# Patient Record
Sex: Female | Born: 1955 | Race: White | Hispanic: No | Marital: Single | Smoking: Never smoker
Health system: Southern US, Community
[De-identification: ages and names within clinical notes are randomized; demographics above are authoritative.]

## PROBLEM LIST (undated history)

## (undated) DIAGNOSIS — K219 Gastro-esophageal reflux disease without esophagitis: Secondary | ICD-10-CM

## (undated) DIAGNOSIS — E079 Disorder of thyroid, unspecified: Secondary | ICD-10-CM

## (undated) HISTORY — PX: OTHER SURGICAL HISTORY: SHX169

## (undated) HISTORY — DX: Gastro-esophageal reflux disease without esophagitis: K21.9

## (undated) HISTORY — DX: Disorder of thyroid, unspecified: E07.9

---

## 2004-05-22 ENCOUNTER — Ambulatory Visit: Payer: Self-pay | Admitting: Family Medicine

## 2004-09-22 ENCOUNTER — Ambulatory Visit: Payer: Self-pay | Admitting: Unknown Physician Specialty

## 2005-08-27 ENCOUNTER — Ambulatory Visit: Payer: Self-pay | Admitting: Family Medicine

## 2006-04-20 ENCOUNTER — Ambulatory Visit: Payer: Self-pay | Admitting: Family Medicine

## 2007-11-02 ENCOUNTER — Ambulatory Visit: Payer: Self-pay | Admitting: Family Medicine

## 2009-09-30 ENCOUNTER — Ambulatory Visit: Payer: Self-pay | Admitting: Internal Medicine

## 2010-03-03 ENCOUNTER — Ambulatory Visit: Payer: Self-pay | Admitting: Internal Medicine

## 2012-03-21 ENCOUNTER — Ambulatory Visit: Payer: Self-pay | Admitting: Internal Medicine

## 2012-04-14 ENCOUNTER — Ambulatory Visit: Payer: Self-pay | Admitting: Internal Medicine

## 2013-04-17 ENCOUNTER — Ambulatory Visit: Payer: Self-pay

## 2014-04-23 ENCOUNTER — Ambulatory Visit: Payer: Self-pay

## 2014-04-26 DIAGNOSIS — Z8639 Personal history of other endocrine, nutritional and metabolic disease: Secondary | ICD-10-CM | POA: Insufficient documentation

## 2014-11-05 ENCOUNTER — Other Ambulatory Visit: Payer: Self-pay | Admitting: Nurse Practitioner

## 2014-11-05 DIAGNOSIS — D376 Neoplasm of uncertain behavior of liver, gallbladder and bile ducts: Secondary | ICD-10-CM

## 2014-11-05 DIAGNOSIS — R10811 Right upper quadrant abdominal tenderness: Secondary | ICD-10-CM

## 2014-11-09 ENCOUNTER — Ambulatory Visit: Payer: Self-pay

## 2014-11-15 ENCOUNTER — Ambulatory Visit
Admission: RE | Admit: 2014-11-15 | Discharge: 2014-11-15 | Disposition: A | Discharge status: Home / Self Care | Payer: BLUE CROSS/BLUE SHIELD | Source: Ambulatory Visit | Attending: Nurse Practitioner | Admitting: Nurse Practitioner

## 2014-11-15 ENCOUNTER — Other Ambulatory Visit: Payer: Self-pay | Admitting: *Deleted

## 2014-11-15 DIAGNOSIS — R10811 Right upper quadrant abdominal tenderness: Secondary | ICD-10-CM | POA: Insufficient documentation

## 2014-11-15 DIAGNOSIS — D1803 Hemangioma of intra-abdominal structures: Secondary | ICD-10-CM | POA: Diagnosis not present

## 2014-11-15 DIAGNOSIS — R1011 Right upper quadrant pain: Secondary | ICD-10-CM

## 2014-11-15 DIAGNOSIS — D376 Neoplasm of uncertain behavior of liver, gallbladder and bile ducts: Secondary | ICD-10-CM

## 2014-11-15 MED ORDER — GADOBENATE DIMEGLUMINE 529 MG/ML IV SOLN
10.0000 mL | Freq: Once | INTRAVENOUS | Status: AC | PRN
  Administered 2014-11-15: 10 mL via INTRAVENOUS

## 2016-02-10 DIAGNOSIS — H04123 Dry eye syndrome of bilateral lacrimal glands: Secondary | ICD-10-CM | POA: Diagnosis not present

## 2016-02-12 DIAGNOSIS — M67442 Ganglion, left hand: Secondary | ICD-10-CM | POA: Diagnosis not present

## 2016-02-18 DIAGNOSIS — H04123 Dry eye syndrome of bilateral lacrimal glands: Secondary | ICD-10-CM | POA: Diagnosis not present

## 2016-03-12 DIAGNOSIS — K219 Gastro-esophageal reflux disease without esophagitis: Secondary | ICD-10-CM | POA: Diagnosis not present

## 2016-03-12 DIAGNOSIS — E039 Hypothyroidism, unspecified: Secondary | ICD-10-CM | POA: Diagnosis not present

## 2016-03-12 DIAGNOSIS — Z0001 Encounter for general adult medical examination with abnormal findings: Secondary | ICD-10-CM | POA: Diagnosis not present

## 2016-03-12 DIAGNOSIS — R7301 Impaired fasting glucose: Secondary | ICD-10-CM | POA: Diagnosis not present

## 2016-03-12 DIAGNOSIS — E559 Vitamin D deficiency, unspecified: Secondary | ICD-10-CM | POA: Diagnosis not present

## 2016-03-12 DIAGNOSIS — Z1159 Encounter for screening for other viral diseases: Secondary | ICD-10-CM | POA: Diagnosis not present

## 2016-03-13 DIAGNOSIS — E559 Vitamin D deficiency, unspecified: Secondary | ICD-10-CM | POA: Diagnosis not present

## 2016-03-13 DIAGNOSIS — Z0001 Encounter for general adult medical examination with abnormal findings: Secondary | ICD-10-CM | POA: Diagnosis not present

## 2016-03-13 DIAGNOSIS — E039 Hypothyroidism, unspecified: Secondary | ICD-10-CM | POA: Diagnosis not present

## 2016-03-13 DIAGNOSIS — R7301 Impaired fasting glucose: Secondary | ICD-10-CM | POA: Diagnosis not present

## 2016-03-25 DIAGNOSIS — M67442 Ganglion, left hand: Secondary | ICD-10-CM | POA: Diagnosis not present

## 2016-04-15 DIAGNOSIS — H401131 Primary open-angle glaucoma, bilateral, mild stage: Secondary | ICD-10-CM | POA: Diagnosis not present

## 2016-09-08 DIAGNOSIS — E039 Hypothyroidism, unspecified: Secondary | ICD-10-CM | POA: Diagnosis not present

## 2016-09-08 DIAGNOSIS — K219 Gastro-esophageal reflux disease without esophagitis: Secondary | ICD-10-CM | POA: Diagnosis not present

## 2016-09-08 DIAGNOSIS — R002 Palpitations: Secondary | ICD-10-CM | POA: Diagnosis not present

## 2016-09-09 DIAGNOSIS — R079 Chest pain, unspecified: Secondary | ICD-10-CM | POA: Diagnosis not present

## 2016-09-09 DIAGNOSIS — R001 Bradycardia, unspecified: Secondary | ICD-10-CM | POA: Diagnosis not present

## 2016-10-12 DIAGNOSIS — R7301 Impaired fasting glucose: Secondary | ICD-10-CM | POA: Diagnosis not present

## 2016-10-12 DIAGNOSIS — R001 Bradycardia, unspecified: Secondary | ICD-10-CM | POA: Diagnosis not present

## 2016-10-12 DIAGNOSIS — K219 Gastro-esophageal reflux disease without esophagitis: Secondary | ICD-10-CM | POA: Diagnosis not present

## 2016-10-12 DIAGNOSIS — R002 Palpitations: Secondary | ICD-10-CM | POA: Diagnosis not present

## 2016-10-12 DIAGNOSIS — E039 Hypothyroidism, unspecified: Secondary | ICD-10-CM | POA: Diagnosis not present

## 2016-11-12 DIAGNOSIS — R002 Palpitations: Secondary | ICD-10-CM | POA: Diagnosis not present

## 2017-03-17 DIAGNOSIS — E039 Hypothyroidism, unspecified: Secondary | ICD-10-CM | POA: Diagnosis not present

## 2017-03-17 DIAGNOSIS — R002 Palpitations: Secondary | ICD-10-CM | POA: Diagnosis not present

## 2017-03-17 DIAGNOSIS — Z0000 Encounter for general adult medical examination without abnormal findings: Secondary | ICD-10-CM | POA: Diagnosis not present

## 2017-04-14 DIAGNOSIS — H401131 Primary open-angle glaucoma, bilateral, mild stage: Secondary | ICD-10-CM | POA: Diagnosis not present

## 2017-04-14 DIAGNOSIS — H04123 Dry eye syndrome of bilateral lacrimal glands: Secondary | ICD-10-CM | POA: Diagnosis not present

## 2017-06-01 DIAGNOSIS — H401131 Primary open-angle glaucoma, bilateral, mild stage: Secondary | ICD-10-CM | POA: Diagnosis not present

## 2017-07-29 DIAGNOSIS — H5210 Myopia, unspecified eye: Secondary | ICD-10-CM | POA: Diagnosis not present

## 2017-07-29 DIAGNOSIS — H2513 Age-related nuclear cataract, bilateral: Secondary | ICD-10-CM | POA: Diagnosis not present

## 2017-07-29 DIAGNOSIS — H04123 Dry eye syndrome of bilateral lacrimal glands: Secondary | ICD-10-CM | POA: Diagnosis not present

## 2017-08-17 DIAGNOSIS — H2513 Age-related nuclear cataract, bilateral: Secondary | ICD-10-CM | POA: Diagnosis not present

## 2017-08-17 DIAGNOSIS — H04123 Dry eye syndrome of bilateral lacrimal glands: Secondary | ICD-10-CM | POA: Diagnosis not present

## 2017-08-17 DIAGNOSIS — H04563 Stenosis of bilateral lacrimal punctum: Secondary | ICD-10-CM | POA: Diagnosis not present

## 2017-08-24 ENCOUNTER — Other Ambulatory Visit: Payer: Self-pay

## 2017-08-24 MED ORDER — LEVOTHYROXINE SODIUM 75 MCG PO TABS: 75.0000 ug | ORAL_TABLET | Freq: Every day | ORAL | 5 refills | Status: DC

## 2017-09-13 ENCOUNTER — Encounter: Payer: Self-pay | Admitting: Nurse Practitioner

## 2017-09-13 ENCOUNTER — Ambulatory Visit: Payer: BLUE CROSS/BLUE SHIELD | Admitting: Nurse Practitioner

## 2017-09-13 VITALS — BP 113/68 | HR 55 | Resp 16 | Ht 64.0 in | Wt 99.0 lb

## 2017-09-13 DIAGNOSIS — I7300 Raynaud's syndrome without gangrene: Secondary | ICD-10-CM | POA: Insufficient documentation

## 2017-09-13 DIAGNOSIS — R002 Palpitations: Secondary | ICD-10-CM

## 2017-09-13 DIAGNOSIS — K439 Ventral hernia without obstruction or gangrene: Secondary | ICD-10-CM

## 2017-09-13 DIAGNOSIS — R10827 Generalized rebound abdominal tenderness: Secondary | ICD-10-CM | POA: Diagnosis not present

## 2017-09-13 DIAGNOSIS — E039 Hypothyroidism, unspecified: Secondary | ICD-10-CM | POA: Diagnosis not present

## 2017-09-13 MED ORDER — LEVOTHYROXINE SODIUM 75 MCG PO TABS
75.0000 ug | ORAL_TABLET | Freq: Every day | ORAL | 5 refills | Status: DC
Start: 2017-09-13 — End: 2018-07-29

## 2017-09-13 NOTE — Progress Notes (Signed)
[facility] [place], [place] [address]  Internal MEDICINE  Office Visit Note  Patient Name: Regina Irwin  [id number]  [id number]  Date of Service: 09/13/2017  Chief Complaint  Patient presents with  . Hypothyroidism    The patient is here for routine follow up visit. She states that she continues to have issues with her heart. States that it does "funny things."  Still has palpitations. Prior studies have been essentially normal. She also suffers from very cold hands and feet. Worse when it's cold outside. Her fingers are especially cold. actally affecting her work this year. Right index finger will get so cold it gets numb. Difficult to use the mouse.  Also having trouble with her stomach. Everyday, a few hours after she eats lunch, she gets "tearing" type pain in the area around her abdomen. Lasts for an hour or so then resolves. She does feel a palpable mass in the area of her umbilicus. Prior studies of the abdomen have been unremarkable.    Pt is here for routine follow up.    Current Medication: Outpatient Encounter Medications as of 09/13/2017  Medication Sig  . Cholecalciferol (VITAMIN D3) 1000 units CAPS Take by mouth.  . levothyroxine (SYNTHROID, LEVOTHROID) 75 MCG tablet Take 1 tablet (75 mcg total) by mouth daily before breakfast.  . Multiple Vitamin (MULTI-VITAMINS) TABS Take by mouth.  . [DISCONTINUED] levothyroxine (SYNTHROID, LEVOTHROID) 75 MCG tablet Take 1 tablet (75 mcg total) by mouth daily before breakfast.   No facility-administered encounter medications on file as of 09/13/2017.     Surgical History: Past Surgical History:  Procedure Laterality Date  . tumor removed  right foot      Medical History: Past Medical History:  Diagnosis Date  . Thyroid disease     Family History: Family History  Problem Relation Age of Onset  . Diabetes Father   . Drug abuse Paternal Grandmother   . Drug abuse Paternal Grandfather      Social History   Socioeconomic History  . Marital status: Single    Spouse name: Not on file  . Number of children: Not on file  . Years of education: Not on file  . Highest education level: Not on file  Occupational History  . Not on file  Social Needs  . Financial resource strain: Not on file  . Food insecurity:    Worry: Not on file    Inability: Not on file  . Transportation needs:    Medical: Not on file    Non-medical: Not on file  Tobacco Use  . Smoking status: Never Smoker  . Smokeless tobacco: Never Used  Substance and Sexual Activity  . Alcohol use: Never    Frequency: Never  . Drug use: Never  . Sexual activity: Not on file  Lifestyle  . Physical activity:    Days per week: Not on file    Minutes per session: Not on file  . Stress: Not on file  Relationships  . Social connections:    Talks on phone: Not on file    Gets together: Not on file    Attends religious service: Not on file    Active member of club or organization: Not on file    Attends meetings of clubs or organizations: Not on file    Relationship status: Not on file  . Intimate partner violence:    Fear of current or ex partner: Not on file    Emotionally abused: Not on  file    Physically abused: Not on file    Forced sexual activity: Not on file  Other Topics Concern  . Not on file  Social History Narrative  . Not on file      Review of Systems  Constitutional: Negative for activity change, chills, fatigue and unexpected weight change.  HENT: Positive for postnasal drip. Negative for congestion, rhinorrhea, sneezing and sore throat.   Eyes: Negative.  Negative for redness.  Respiratory: Negative for cough, chest tightness and shortness of breath.   Cardiovascular: Negative for chest pain and palpitations.       Palpitations.  Gastrointestinal: Positive for abdominal pain, constipation and diarrhea. Negative for nausea and vomiting.  Endocrine:       Hypothyroid.    Genitourinary: Negative for dysuria and frequency.  Musculoskeletal: Negative for arthralgias, back pain, joint swelling and neck pain.  Skin: Negative for rash.  Neurological: Negative for dizziness, tremors, numbness and headaches.  Hematological: Negative for adenopathy. Does not bruise/bleed easily.  Psychiatric/Behavioral: Negative for behavioral problems (Depression), sleep disturbance and suicidal ideas. The patient is nervous/anxious.     Today's Vitals   09/13/17 0931  BP: 113/68  Pulse: (!) 55  Resp: 16  SpO2: 98%  Weight: 99 lb (44.9 kg)  Height: 5\' 4"  (2.993 m)    Physical Exam  Constitutional: She is oriented to person, place, and time. She appears well-developed and well-nourished. No distress.  HENT:  Head: Normocephalic and atraumatic.  Mouth/Throat: Oropharynx is clear and moist. No oropharyngeal exudate.  Eyes: Pupils are equal, round, and reactive to light. EOM are normal.  Neck: Normal range of motion. Neck supple. No JVD present. Carotid bruit is not present. No tracheal deviation present. No thyromegaly present.  Cardiovascular: Normal rate, regular rhythm and normal heart sounds. Exam reveals no gallop and no friction rub.  No murmur heard. Pulmonary/Chest: Effort normal. No respiratory distress. She has no wheezes. She has no rales. She exhibits no tenderness.  Abdominal: Soft. Bowel sounds are increased. There is no hepatosplenomegaly. There is tenderness in the periumbilical area. There is no CVA tenderness. A hernia is present. Hernia confirmed positive in the ventral area.    Musculoskeletal: Normal range of motion.  Lymphadenopathy:    She has no cervical adenopathy.  Neurological: She is alert and oriented to person, place, and time. No cranial nerve deficit.  Skin: Skin is warm and dry. She is not diaphoretic.  Psychiatric: She has a normal mood and affect. Her behavior is normal. Judgment and thought content normal.  Nursing note and vitals  reviewed.   Assessment/Plan: 1. Acquired hypothyroidism Lab slip givent to check thyroid panel. Will adjust levothyroxine as indicated.  - levothyroxine (SYNTHROID, LEVOTHROID) 75 MCG tablet; Take 1 tablet (75 mcg total) by mouth daily before breakfast.  Dispense: 30 tablet; Refill: 5  2. Generalized abdominal tenderness with rebound tenderness. Very hyperactive bowel sounds present.  - US Abdomen Complete; Future  3. Ventral hernia without obstruction or gangrene Will get abdominal ultrasound for further evaluation. Refer to surgery/GI as idicated   4. Palpitations Cardiac testing has been unremarkable. Will monitor.   5. Raynaud's phenomenon. - discussed addition of calcium channel blocker to reduce vascular spasms and regulate heart rhythm. Patient wishes to hold off on this treatment for now.   General Counseling: ann-marie kluge understanding of the findings of todays visit and agrees with plan of treatment. I have discussed any further diagnostic evaluation that may be needed or ordered today. We also  reviewed her medications today. she has been encouraged to call the office with any questions or concerns that should arise related to todays visit.   This patient was seen by Leretha Pol, FNP- C in Collaboration with Dr Lavera Guise as a part of collaborative care agreement    Orders Placed This Encounter  Procedures  . US Abdomen Complete    Meds ordered this encounter  Medications  . levothyroxine (SYNTHROID, LEVOTHROID) 75 MCG tablet    Sig: Take 1 tablet (75 mcg total) by mouth daily before breakfast.    Dispense:  30 tablet    Refill:  5    Order Specific Question:   Supervising Provider    Answer:   Lavera Guise [[id number]]    Time spent: 37 Minutes    Dr Lavera Guise Internal medicine

## 2017-10-22 ENCOUNTER — Ambulatory Visit (INDEPENDENT_AMBULATORY_CARE_PROVIDER_SITE_OTHER): Payer: BLUE CROSS/BLUE SHIELD

## 2017-10-22 DIAGNOSIS — R10827 Generalized rebound abdominal tenderness: Secondary | ICD-10-CM

## 2017-10-29 ENCOUNTER — Ambulatory Visit: Payer: Self-pay | Admitting: Nurse Practitioner

## 2017-11-01 ENCOUNTER — Encounter: Payer: Self-pay | Admitting: Nurse Practitioner

## 2017-11-01 ENCOUNTER — Ambulatory Visit: Payer: BLUE CROSS/BLUE SHIELD | Admitting: Nurse Practitioner

## 2017-11-01 VITALS — BP 102/49 | HR 55 | Resp 16 | Ht 64.0 in | Wt 98.2 lb

## 2017-11-01 DIAGNOSIS — K828 Other specified diseases of gallbladder: Secondary | ICD-10-CM

## 2017-11-01 DIAGNOSIS — R10827 Generalized rebound abdominal tenderness: Secondary | ICD-10-CM | POA: Diagnosis not present

## 2017-11-01 DIAGNOSIS — E039 Hypothyroidism, unspecified: Secondary | ICD-10-CM | POA: Diagnosis not present

## 2017-11-01 NOTE — Patient Instructions (Signed)
Cholecystitis °Cholecystitis is swelling and irritation (inflammation) of the gallbladder. The gallbladder is an organ that is shaped like a pear. It is under the liver on the right side of the body. This condition is often caused by gallstones. You doctor may do tests to see how your gallbladder works. These tests may include: °· Imaging tests, such as: °? An ultrasound. °? MRI. °· Tests that check how your liver works. ° °This condition needs treatment. °Follow these instructions at home: °Home care will depend on your treatment. In general: °· Take over-the-counter and prescription medicines only as told by your doctor. °· If you were prescribed an antibiotic medicine, take it as told by your doctor. Do not stop taking the antibiotic even if you start to feel better. °· Follow instructions from your doctor about what to eat or drink. When you are allowed to eat, avoid eating or drinking anything that causes your symptoms to start. °· Keep all follow-up visits as told by your doctor. This is important. ° °Contact a doctor if: °· You have pain and your medicine does not help. °· You have a fever. °Get help right away if: °· Your pain moves to: °? Another part of your belly (abdomen). °? Your back. °· Your symptoms do not go away. °· You have new symptoms. °This information is not intended to replace advice given to you by your health care provider. Make sure you discuss any questions you have with your health care provider. °Document Released: 05/28/2011 Document Revised: 11/14/2015 Document Reviewed: 09/19/2014 °Elsevier Interactive Patient Education © 2018 [employer] Inc[employer] ° °

## 2017-11-01 NOTE — Progress Notes (Signed)
[facility] [place], [place] [address]  Internal MEDICINE  Office Visit Note  Patient Name: Regina Irwin  [id number]  [id number]  Date of Service: 11/01/2017   Pt is here for routine follow up.   Chief Complaint  Patient presents with  . Hypothyroidism    follow up US    Also having trouble with her stomach. Everyday, a few hours after she eats lunch, she gets "tearing" type pain in the area around her abdomen. Lasts for an hour or so then resolves. She does feel a palpable mass in the area of her umbilicus. Prior studies of the abdomen have been unremarkable. She has had abdominal ultrasound since her last visit. There is minimal sludge present in the gallbladder. The study was otherwise normal .       Current Medication: Outpatient Encounter Medications as of 11/01/2017  Medication Sig  . Cholecalciferol (VITAMIN D3) 1000 units CAPS Take by mouth.  . levothyroxine (SYNTHROID, LEVOTHROID) 75 MCG tablet Take 1 tablet (75 mcg total) by mouth daily before breakfast.  . Multiple Vitamin (MULTI-VITAMINS) TABS Take by mouth.   No facility-administered encounter medications on file as of 11/01/2017.     Surgical History: Past Surgical History:  Procedure Laterality Date  . tumor removed  right foot      Medical History: Past Medical History:  Diagnosis Date  . Thyroid disease     Family History: Family History  Problem Relation Age of Onset  . Diabetes Father   . Drug abuse Paternal Grandmother   . Drug abuse Paternal Grandfather     Social History   Socioeconomic History  . Marital status: Single    Spouse name: Not on file  . Number of children: Not on file  . Years of education: Not on file  . Highest education level: Not on file  Occupational History  . Not on file  Social Needs  . Financial resource strain: Not on file  . Food insecurity:    Worry: Not on file    Inability: Not on file  . Transportation needs:    Medical:  Not on file    Non-medical: Not on file  Tobacco Use  . Smoking status: Never Smoker  . Smokeless tobacco: Never Used  Substance and Sexual Activity  . Alcohol use: Never    Frequency: Never  . Drug use: Never  . Sexual activity: Not on file  Lifestyle  . Physical activity:    Days per week: Not on file    Minutes per session: Not on file  . Stress: Not on file  Relationships  . Social connections:    Talks on phone: Not on file    Gets together: Not on file    Attends religious service: Not on file    Active member of club or organization: Not on file    Attends meetings of clubs or organizations: Not on file    Relationship status: Not on file  . Intimate partner violence:    Fear of current or ex partner: Not on file    Emotionally abused: Not on file    Physically abused: Not on file    Forced sexual activity: Not on file  Other Topics Concern  . Not on file  Social History Narrative  . Not on file      Review of Systems  Constitutional: Positive for appetite change. Negative for activity change, chills, fatigue and unexpected weight change.  HENT: Positive for postnasal  drip. Negative for congestion, rhinorrhea, sneezing and sore throat.   Eyes: Negative.  Negative for redness.  Respiratory: Negative for cough, chest tightness and shortness of breath.   Cardiovascular: Negative for chest pain and palpitations.       Palpitations.  Gastrointestinal: Positive for abdominal pain, constipation, diarrhea and nausea. Negative for vomiting.  Endocrine: Negative for cold intolerance, heat intolerance, polydipsia, polyphagia and polyuria.       Hypothyroid.   Genitourinary: Negative for dysuria and frequency.  Musculoskeletal: Negative for arthralgias, back pain, joint swelling and neck pain.  Skin: Negative for rash.  Allergic/Immunologic: Negative for environmental allergies.  Neurological: Negative for dizziness, tremors, numbness and headaches.  Hematological:  Negative for adenopathy. Does not bruise/bleed easily.  Psychiatric/Behavioral: Negative for behavioral problems (Depression), sleep disturbance and suicidal ideas. The patient is nervous/anxious.     Vital Signs: BP (!) 102/49   Pulse (!) 55   Resp 16   Ht 5\' 4"  (1.626 m)   Wt 98 lb 3.2 oz (44.5 kg)   SpO2 100%   BMI 16.86 kg/m    Physical Exam  Constitutional: She is oriented to person, place, and time. She appears well-developed and well-nourished. No distress.  HENT:  Head: Normocephalic and atraumatic.  Mouth/Throat: Oropharynx is clear and moist. No oropharyngeal exudate.  Eyes: Pupils are equal, round, and reactive to light. EOM are normal.  Neck: Normal range of motion. Neck supple. No JVD present. Carotid bruit is not present. No tracheal deviation present. No thyromegaly present.  Cardiovascular: Normal rate, regular rhythm and normal heart sounds. Exam reveals no gallop and no friction rub.  No murmur heard. Pulmonary/Chest: Effort normal. No respiratory distress. She has no wheezes. She has no rales. She exhibits no tenderness.  Abdominal: Soft. Bowel sounds are increased. There is no hepatosplenomegaly. There is tenderness in the periumbilical area. There is no CVA tenderness. A hernia is present. Hernia confirmed positive in the ventral area.    Musculoskeletal: Normal range of motion.  Lymphadenopathy:    She has no cervical adenopathy.  Neurological: She is alert and oriented to person, place, and time. No cranial nerve deficit.  Skin: Skin is warm and dry. She is not diaphoretic.  Psychiatric: She has a normal mood and affect. Her behavior is normal. Judgment and thought content normal.  Nursing note and vitals reviewed.   Assessment/Plan: 1. Generalized abdominal tenderness with rebound tenderness Abdominal ultrasound did show minimal sludge in the gallbladder, but was otherwise normal. Due to positive symptoms and u/s results, refer to surgery for further  evaluation and treatment.  - Ambulatory referral to General Surgery  2. Gallbladder sludge - Ambulatory referral to General Surgery  3. Acquired hypothyroidism Stable. Continue levothyroxine as prescribed.   General Counseling: sallee hogrefe understanding of the findings of todays visit and agrees with plan of treatment. I have discussed any further diagnostic evaluation that may be needed or ordered today. We also reviewed her medications today. she has been encouraged to call the office with any questions or concerns that should arise related to todays visit.  This patient was seen by Leretha Pol, FNP- C in Collaboration with Dr Lavera Guise as a part of collaborative care agreement   Orders Placed This Encounter  Procedures  . Ambulatory referral to General Surgery      Time spent: [place] [facility]

## 2017-11-02 ENCOUNTER — Encounter: Payer: Self-pay | Admitting: *Deleted

## 2017-11-11 ENCOUNTER — Ambulatory Visit: Payer: BLUE CROSS/BLUE SHIELD | Admitting: Nurse Practitioner

## 2017-11-11 ENCOUNTER — Encounter: Payer: Self-pay | Admitting: Nurse Practitioner

## 2017-11-11 VITALS — BP 108/60 | HR 87 | Resp 16 | Ht 64.0 in | Wt 98.6 lb

## 2017-11-11 DIAGNOSIS — R5383 Other fatigue: Secondary | ICD-10-CM

## 2017-11-11 DIAGNOSIS — E039 Hypothyroidism, unspecified: Secondary | ICD-10-CM | POA: Diagnosis not present

## 2017-11-11 DIAGNOSIS — R413 Other amnesia: Secondary | ICD-10-CM

## 2017-11-11 DIAGNOSIS — E162 Hypoglycemia, unspecified: Secondary | ICD-10-CM

## 2017-11-11 NOTE — Progress Notes (Signed)
[facility] [place], [place] [address]  Internal MEDICINE  Office Visit Note  Patient Name: Regina Irwin  [id number]  [id number]  Date of Service: 12/04/2017    Pt is here for a sick visit.  Chief Complaint  Patient presents with  . Memory Loss    process of being terminated      The patient has been having trouble with her memory and with concentration. She is having trouble leaving this out at work. Getting "dinged" for leaving information out when she is entering information at work. Will have correct results, however, she will have no memory of leaving out some of the information. She has had episodes when she has had to move and count things, and then when completed, she finds that she missed an entire row of something without having any memory of ever seeing the products she was to include in her count. Has been an issue several years ago, when first diagnosed with thyroid issues. She is also concerned that low sugars could be causing this to happen.       Current Medication:  Outpatient Encounter Medications as of 11/11/2017  Medication Sig  . Cholecalciferol (VITAMIN D3) 1000 units CAPS Take by mouth.  . levothyroxine (SYNTHROID, LEVOTHROID) 75 MCG tablet Take 1 tablet (75 mcg total) by mouth daily before breakfast.  . Multiple Vitamin (MULTI-VITAMINS) TABS Take by mouth.   No facility-administered encounter medications on file as of 11/11/2017.       Medical History: Past Medical History:  Diagnosis Date  . Thyroid disease      Today's Vitals   11/11/17 1016  BP: 108/60  Pulse: 87  Resp: 16  SpO2: 90%  Weight: 98 lb 9.6 oz (44.7 kg)  Height: 5\' 4"  (1.626 m)    Review of Systems  Constitutional: Positive for activity change. Negative for chills, fatigue and unexpected weight change.  HENT: Negative for congestion, postnasal drip, rhinorrhea, sneezing and sore throat.   Eyes: Negative.  Negative for redness.  Respiratory:  Negative for cough, chest tightness and shortness of breath.   Cardiovascular: Negative for chest pain and palpitations.  Gastrointestinal: Negative for abdominal pain, constipation, diarrhea, nausea and vomiting.  Endocrine:       Has had stable thyroid panel over past several years. Has had a few episodes of low blood sugars.   Genitourinary: Negative for dysuria and frequency.  Musculoskeletal: Negative for arthralgias, back pain, joint swelling and neck pain.  Skin: Negative for rash.  Allergic/Immunologic: Negative for environmental allergies.  Neurological: Negative for tremors and numbness.       Memory issues. Has episodes of time she has no memory or recollection of.   Hematological: Negative for adenopathy. Does not bruise/bleed easily.  Psychiatric/Behavioral: Positive for decreased concentration. Negative for behavioral problems (Depression), sleep disturbance and suicidal ideas. The patient is nervous/anxious.     Physical Exam  Constitutional: She is oriented to person, place, and time. She appears well-developed and well-nourished. No distress.  HENT:  Head: Normocephalic and atraumatic.  Mouth/Throat: Oropharynx is clear and moist. No oropharyngeal exudate.  Eyes: Pupils are equal, round, and reactive to light. EOM are normal.  Neck: Normal range of motion. Neck supple. No JVD present. No tracheal deviation present. No thyromegaly present.  Cardiovascular: Normal rate, regular rhythm and normal heart sounds. Exam reveals no gallop and no friction rub.  No murmur heard. Pulmonary/Chest: Effort normal. No respiratory distress. She has no wheezes. She has no rales. She  exhibits no tenderness.  Abdominal: Soft. Bowel sounds are normal.  Musculoskeletal: Normal range of motion.  Lymphadenopathy:    She has no cervical adenopathy.  Neurological: She is alert and oriented to person, place, and time. No cranial nerve deficit.  A mini mental status exam was administered today and  was normal   Skin: Skin is warm and dry. She is not diaphoretic.  Psychiatric: Her speech is normal and behavior is normal. Judgment and thought content normal. Her mood appears anxious. She exhibits abnormal recent memory.  Nursing note and vitals reviewed.  Assessment/Plan: 1. Memory loss Having issues with intermittent memory loss. Refer to neurology for further evaluation and treatment.  - Ambulatory referral to Neurology  2. Acquired hypothyroidism - TSH + free T4 - Thyroid peroxidase antibody - Thyroglobulin Level  3. Fatigue, unspecified type - CBC w/Diff/Platelet  4. Hypoglycemia Check labs  - HgB A1c   General Counseling: Myrah verbalizes understanding of the findings of todays visit and agrees with plan of treatment. I have discussed any further diagnostic evaluation that may be needed or ordered today. We also reviewed her medications today. she has been encouraged to call the office with any questions or concerns that should arise related to todays visit.    Counseling:  This patient was seen by Leretha Pol, FNP- C in Collaboration with Dr Lavera Guise as a part of collaborative care agreement  Orders Placed This Encounter  Procedures  . TSH + free T4  . Thyroid peroxidase antibody  . CBC w/Diff/Platelet  . HgB A1c  . Thyroglobulin Level  . Ambulatory referral to Neurology      Time spent: 20 Minutes

## 2017-11-16 ENCOUNTER — Encounter: Payer: Self-pay | Admitting: Neurology

## 2017-11-18 LAB — CBC WITH DIFFERENTIAL/PLATELET
Basophils Absolute: 0 10*3/uL (ref 0.0–0.2)
Basos: 1 %
EOS (ABSOLUTE): 0 10*3/uL (ref 0.0–0.4)
EOS: 1 %
HEMATOCRIT: 39.5 % (ref 34.0–46.6)
HEMOGLOBIN: 13.5 g/dL (ref 11.1–15.9)
Immature Grans (Abs): 0 10*3/uL (ref 0.0–0.1)
Immature Granulocytes: 1 %
LYMPHS ABS: 1.1 10*3/uL (ref 0.7–3.1)
LYMPHS: 30 %
MCH: 31.3 pg (ref 26.6–33.0)
MCHC: 34.2 g/dL (ref 31.5–35.7)
MCV: 92 fL (ref 79–97)
MONOCYTES: 12 %
Monocytes Absolute: 0.5 10*3/uL (ref 0.1–0.9)
NEUTROS PCT: 55 %
Neutrophils Absolute: 2.1 10*3/uL (ref 1.4–7.0)
Platelets: 291 10*3/uL (ref 150–450)
RBC: 4.31 x10E6/uL (ref 3.77–5.28)
RDW: 12.6 % (ref 12.3–15.4)
WBC: 3.7 10*3/uL (ref 3.4–10.8)

## 2017-11-18 LAB — THYROGLOBULIN LEVEL: Thyroglobulin (TG-RIA): <2 ng/mL

## 2017-11-18 LAB — HEMOGLOBIN A1C
ESTIMATED AVERAGE GLUCOSE: 94 mg/dL
Hgb A1c MFr Bld: 4.9 % (ref 4.8–5.6)

## 2017-11-18 LAB — THYROID PEROXIDASE ANTIBODY: Thyroperoxidase Ab SerPl-aCnc: 43 IU/mL — ABNORMAL HIGH (ref 0–34)

## 2017-11-18 LAB — TSH+FREE T4
FREE T4: 1.36 ng/dL (ref 0.82–1.77)
TSH: 0.103 u[IU]/mL — ABNORMAL LOW (ref 0.450–4.500)

## 2017-12-02 ENCOUNTER — Telehealth: Payer: Self-pay

## 2017-12-02 NOTE — Telephone Encounter (Signed)
Thyroid levels indicate some hyperthyroid plus thyroid antibodies were elevated. 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 I would like her to see endocrinology for further work up.

## 2017-12-03 NOTE — Telephone Encounter (Signed)
Spoke with pt and informed her of her results. TLW

## 2017-12-04 DIAGNOSIS — E162 Hypoglycemia, unspecified: Secondary | ICD-10-CM | POA: Insufficient documentation

## 2017-12-04 DIAGNOSIS — R5383 Other fatigue: Secondary | ICD-10-CM | POA: Insufficient documentation

## 2017-12-04 DIAGNOSIS — R413 Other amnesia: Secondary | ICD-10-CM | POA: Insufficient documentation

## 2017-12-06 ENCOUNTER — Telehealth: Payer: Self-pay | Admitting: Nurse Practitioner

## 2017-12-06 NOTE — Telephone Encounter (Signed)
Tgis is close to the same as she has been dealing with for a few years. However, if it causing the major issues that it is causing, she needs to see, either endocrinology or neurology for further evaluation and treatment.

## 2017-12-07 ENCOUNTER — Ambulatory Visit: Payer: BLUE CROSS/BLUE SHIELD | Admitting: General Surgery

## 2017-12-07 ENCOUNTER — Encounter: Payer: Self-pay | Admitting: General Surgery

## 2017-12-07 VITALS — BP 98/60 | HR 67 | Resp 12 | Ht 64.0 in | Wt 97.8 lb

## 2017-12-07 DIAGNOSIS — K219 Gastro-esophageal reflux disease without esophagitis: Secondary | ICD-10-CM | POA: Insufficient documentation

## 2017-12-07 NOTE — Progress Notes (Signed)
Patient ID: Regina Irwin, female   DOB: 1955/06/30, 62 y.o.   MRN: [id number]  Chief Complaint  Patient presents with  . Abdominal Pain    HPI Regina Irwin is a 62 y.o. female.  Here today for evaluation of abdominal pain and gall bladder sludge referred by Juliette Alcide NP. Abdominal ultrasound was 10-22-17. She reports that she has been having stomach pain since 2006. She had dropped about 30 pounds at that time and has continued to lose weight with a total weight loss of 50 pounds. She reports that about 4 hours after eating her last meal around 4:30 pm she feels very unwell. No pain recently but it is GI in origin. She had a sharp pain with a tearing sensation in her belly button a few years ago.  She has had multiple ultrasounds done in the past. She uses the bathroom daily.  She has been drinking tea with Anise in it and it has helped with her gas. She has a history of reflux at night.     HPI  Past Medical History:  Diagnosis Date  . GERD (gastroesophageal reflux disease)   . Thyroid disease     Past Surgical History:  Procedure Laterality Date  . tumor removed  right foot      Family History  Problem Relation Age of Onset  . Diabetes Father   . Lung cancer Father   . Drug abuse Paternal Grandmother   . Drug abuse Paternal Grandfather   . Gallstones Mother   . Melanoma Mother   . Melanoma Brother   . Cancer Maternal Grandmother     Social History Social History   Tobacco Use  . Smoking status: Never Smoker  . Smokeless tobacco: Never Used  Substance Use Topics  . Alcohol use: Never    Frequency: Never  . Drug use: Never    No Known Allergies  Current Outpatient Medications  Medication Sig Dispense Refill  . Cholecalciferol (VITAMIN D3) 1000 units CAPS Take by mouth.    . famotidine (PEPCID) 20 MG tablet Take 20 mg by mouth at bedtime.    Marland Kitchen levothyroxine (SYNTHROID, LEVOTHROID) 75 MCG tablet Take 1 tablet (75 mcg total) by mouth daily before breakfast.  30 tablet 5  . Multiple Vitamin (MULTI-VITAMINS) TABS Take by mouth.     No current facility-administered medications for this visit.     Review of Systems Review of Systems  Constitutional: Negative.   Respiratory: Negative.   Cardiovascular: Negative.   Gastrointestinal: Positive for abdominal pain (mild). Negative for abdominal distention, anal bleeding, blood in stool, constipation, diarrhea, nausea, rectal pain and vomiting.    Blood pressure 98/60, pulse 67, resp. rate 12, height 5\' 4"  (1.626 m), weight 97 lb 12.8 oz (44.4 kg).  Physical Exam Physical Exam  Constitutional: She is oriented to person, place, and time. She appears well-developed and well-nourished.  Eyes: Conjunctivae are normal. No scleral icterus.  Neck: Neck supple.  Cardiovascular: Normal rate, regular rhythm and normal heart sounds.  Pulmonary/Chest: Effort normal and breath sounds normal.  Abdominal: Soft. Normal appearance and bowel sounds are normal. She exhibits no distension.    Lymphadenopathy:    She has no cervical adenopathy.  Neurological: She is alert and oriented to person, place, and time.  Skin: Skin is warm and dry.  Psychiatric: She has a normal mood and affect.    Data Reviewed Laboratory studies dated Nov 11, 2017 showed a hemoglobin A1c of less than 5 and a normal CBC.  Abdominal ultrasound completed at her PCP office dated Oct 22, 2017 reported minimal sludge in the gallbladder.  No gallbladder wall thickening, pericholecystic fluid or gallstones.  Normal bile duct.  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  No evidence of biliary neoplasm or dilatation.  Assessment    Clinical history not compatible with biliary tract source.  No evidence of umbilical hernia.  History profound weight loss, now stable.  Likely difficult to gain weight on vegan diet.  Nocturnal reflux awakening her each morning  2-3 AM.  Discontinued omeprazole for fear of cancer.  Zantac caused unacceptable reaction.  Pepcid with some improvement.    Plan    At this time, I do not see evidence of biliary disease.  With her very low fat diet, she may have sludge because of poor biliary stimulation.  Without any findings of gallbladder wall thickening, episodic pain within an hour of eating or other symptoms, would not recommend cholecystectomy.  The patient reports that she keeps the head of her bed elevated, but still has nocturnal reflux.  She would benefit from reassessment by GI.  While she is concerned about multiple side effects from medications, the ongoing reflux may be putting her at risk for esophageal damage.    She might benefit from Reglan at bedtime to minimize reflux episodes.  She has been asked to consider to improve the quantity of fat in her diet the idea that she may gain a little weight and provide a better stimulus for biliary tract emptying to minimize progression of sludge to symptomatic stones.    HPI, Physical Exam, Assessment and Plan have been scribed under the direction and in the presence of Robert Bellow, MD  Concepcion Living, LPN   HPI, Physical Exam, Assessment and Plan have been scribed under the direction and in the presence of Robert Bellow, MD. Karie Fetch, RN  I have completed the exam and reviewed the above documentation for accuracy and completeness.  I agree with the above.  [employer] has been used and any errors in dictation or transcription are unintentional.  Hervey Ard, M.D., F.A.C.S. . Forest Gleason Hawke Villalpando 12/07/2017, 1:19 PM

## 2017-12-07 NOTE — Patient Instructions (Addendum)
Try eating a little more fat in your diet. Avocados, peanut butter.  The patient is aware to call back for any questions or concerns.

## 2017-12-07 NOTE — Telephone Encounter (Signed)
Patient advised on results and being referred out , patient is seeing Gi specialist today and wants to see what he says before taken the next step. Patient will call back if she wants to see the neurologist or endocrinologist

## 2017-12-21 ENCOUNTER — Telehealth: Payer: Self-pay

## 2017-12-21 NOTE — Telephone Encounter (Signed)
Patient had called answer service asking about a medication Dr Bary Castilla had recommended for her to try. I left a message that he had spoken with her about trying Reglan at night to help minimize her reflux. She may call back with any questions or if she want [place] to send in a prescription for her to try this.

## 2017-12-21 NOTE — Telephone Encounter (Signed)
Left message and asked patient to call and let me know which endocrinology provider she would like to see, she can call her insurance company and they will advise her of in network providers. Beth

## 2017-12-22 ENCOUNTER — Other Ambulatory Visit: Payer: Self-pay | Admitting: General Surgery

## 2017-12-22 ENCOUNTER — Telehealth: Payer: Self-pay

## 2017-12-22 MED ORDER — METOCLOPRAMIDE HCL 5 MG PO TABS
5.0000 mg | ORAL_TABLET | Freq: Every day | ORAL | 0 refills | Status: DC
Start: 2017-12-22 — End: 2018-11-21

## 2017-12-22 NOTE — Telephone Encounter (Signed)
The patient called and would like to try starting Reglan at night. She does have concerns about any side effects and if she can use this medication long term. She would also like to know how long it may take for her gallbladder to start emptying once she has started increasing the fats in her diet and if any further testing should be done to verify this.

## 2017-12-22 NOTE — Progress Notes (Signed)
The patient contact the office and expressed a desire to make use of a trial of bedtime Reglan to help with her reflux.  Will make use of a low dose, 5 mg nightly for 1 month to see her response.

## 2017-12-27 ENCOUNTER — Telehealth: Payer: Self-pay

## 2017-12-27 NOTE — Telephone Encounter (Signed)
Honestly, I would stop taking it due to potential issues with her heart rate and especially if it is not making any difference. She should ask Dr. Festus Aloe office if there is something else they recommend.

## 2017-12-28 NOTE — Telephone Encounter (Signed)
Called pt left a message on voicemail informing her of the medication that she had a concern about and to speak with the doctor that had given her the medication.

## 2017-12-29 ENCOUNTER — Telehealth: Payer: Self-pay

## 2017-12-29 NOTE — Telephone Encounter (Signed)
Patient called with concerns about taking the Reglan you had suggested. She spoke with her Nurse Practitioner about this and was advised not to take it due to her heart rate as it is contraindicated for anyone with heart arrhythmias. She would like to know if you have any other suggestions or if she should go back to taking her Pepcid. I did recommend her to see GI again as you suggested but she would like you opinion.

## 2017-12-31 ENCOUNTER — Telehealth: Payer: Self-pay

## 2017-12-31 ENCOUNTER — Telehealth: Payer: Self-pay | Admitting: Nurse Practitioner

## 2017-12-31 NOTE — Telephone Encounter (Signed)
Patient states that she was advised to stop taking the medication metoclopramide and wants to know if Nira Conn thinks it would be ok to start an aerobic exercise program, and if so what does she suggest ?

## 2017-12-31 NOTE — Telephone Encounter (Signed)
If she does aerobic exercise should be very low impact. She should start low and gradually increase exercise as tolerated.

## 2017-12-31 NOTE — Telephone Encounter (Signed)
PT CALLED IN REGARDS TO HER QUESTION ABOUT EXERCISE AND I LET PT KNOW ABOUT AEROBIC EXERCISE IS OKAY WITH LOW IMPACT AND TO GRADUALLY INCREASE INTENSITY OF EXERCISE AS TOLERATED PER Regina Irwin.

## 2018-01-03 ENCOUNTER — Telehealth: Payer: Self-pay | Admitting: General Surgery

## 2018-01-03 NOTE — Telephone Encounter (Signed)
Patient called & was checking on message from 12-29-17 about the Reglan vs Pepcid. Please advise

## 2018-01-03 NOTE — Telephone Encounter (Signed)
PCP notes several days after her office visit reported that the patient noted no improvement in her nocturnal reflux with Reglan.  Would agree with discontinuation if it is not been effective.  Would recommend formal GI evaluation to assess for esophageal reflux as the source of her symptoms.

## 2018-01-04 NOTE — Telephone Encounter (Signed)
Message left for patient regarding question about her Reglan. Dr Bary Castilla is in agreement with discontinuing this. He does recommend he to get a formal GI assessment.

## 2018-01-14 ENCOUNTER — Ambulatory Visit: Payer: BLUE CROSS/BLUE SHIELD | Admitting: Neurology

## 2018-01-14 ENCOUNTER — Encounter: Payer: Self-pay | Admitting: Neurology

## 2018-01-28 ENCOUNTER — Ambulatory Visit: Payer: BLUE CROSS/BLUE SHIELD | Admitting: Neurology

## 2018-04-20 DIAGNOSIS — H04123 Dry eye syndrome of bilateral lacrimal glands: Secondary | ICD-10-CM | POA: Diagnosis not present

## 2018-05-09 ENCOUNTER — Ambulatory Visit: Payer: Self-pay | Admitting: Nurse Practitioner

## 2018-05-11 ENCOUNTER — Ambulatory Visit: Payer: BLUE CROSS/BLUE SHIELD | Admitting: Nurse Practitioner

## 2018-05-11 ENCOUNTER — Encounter: Payer: Self-pay | Admitting: Nurse Practitioner

## 2018-05-11 VITALS — BP 107/63 | HR 54 | Resp 16 | Ht 64.0 in | Wt 105.0 lb

## 2018-05-11 DIAGNOSIS — E039 Hypothyroidism, unspecified: Secondary | ICD-10-CM

## 2018-05-11 DIAGNOSIS — R10827 Generalized rebound abdominal tenderness: Secondary | ICD-10-CM | POA: Diagnosis not present

## 2018-05-11 DIAGNOSIS — R413 Other amnesia: Secondary | ICD-10-CM

## 2018-05-11 DIAGNOSIS — K828 Other specified diseases of gallbladder: Secondary | ICD-10-CM | POA: Diagnosis not present

## 2018-05-11 NOTE — Progress Notes (Signed)
[facility] [place], [place] [address]  Internal MEDICINE  Office Visit Note  Patient Name: Regina Irwin  [id number]  [id number]  Date of Service: 05/11/2018  Chief Complaint  Patient presents with  . Hypothyroidism  . Gastroesophageal Reflux  . Quality Metric Gaps    colonoscopy and papsmear     The patient has been having trouble with her memory and with concentration. She is having trouble leaving this out at work. Getting "dinged" for leaving information out when she is entering information at work. Was referred to neurology. She had trouble finding the actual office and was 20 minutes late. She was not seen and she did not make another appointment. She states that she has been extra diligent at work. Has been double and triple checking her work to prevent further mistakes. She is officially in corrective action and has to be "mistake-free" for one year. She has also seen general surgery due to issues with her gallbladder. An ultrasound, done here, did show mild gallbladder sludge. After evaluating the patient, surgeon told her the issue may be neuro related. Again, stating the need for her to see a neurologist.       Current Medication: Outpatient Encounter Medications as of 05/11/2018  Medication Sig  . Cholecalciferol (VITAMIN D3) 1000 units CAPS Take by mouth.  . famotidine (PEPCID) 20 MG tablet Take 20 mg by mouth at bedtime.  Marland Kitchen levothyroxine (SYNTHROID, LEVOTHROID) 75 MCG tablet Take 1 tablet (75 mcg total) by mouth daily before breakfast.  . metoCLOPramide (REGLAN) 5 MG tablet Take 1 tablet (5 mg total) by mouth at bedtime.  . Multiple Vitamin (MULTI-VITAMINS) TABS Take by mouth.   No facility-administered encounter medications on file as of 05/11/2018.     Surgical History: Past Surgical History:  Procedure Laterality Date  . tumor removed  right foot      Medical History: Past Medical History:  Diagnosis Date  . GERD  (gastroesophageal reflux disease)   . Thyroid disease     Family History: Family History  Problem Relation Age of Onset  . Diabetes Father   . Lung cancer Father   . Drug abuse Paternal Grandmother   . Drug abuse Paternal Grandfather   . Gallstones Mother   . Melanoma Mother   . Melanoma Brother   . Cancer Maternal Grandmother     Social History   Socioeconomic History  . Marital status: Single    Spouse name: Not on file  . Number of children: Not on file  . Years of education: Not on file  . Highest education level: Not on file  Occupational History  . Not on file  Social Needs  . Financial resource strain: Not on file  . Food insecurity:    Worry: Not on file    Inability: Not on file  . Transportation needs:    Medical: Not on file    Non-medical: Not on file  Tobacco Use  . Smoking status: Never Smoker  . Smokeless tobacco: Never Used  Substance and Sexual Activity  . Alcohol use: Never    Frequency: Never  . Drug use: Never  . Sexual activity: Not on file  Lifestyle  . Physical activity:    Days per week: Not on file    Minutes per session: Not on file  . Stress: Not on file  Relationships  . Social connections:    Talks on phone: Not on file    Gets together: Not on file  Attends religious service: Not on file    Active member of club or organization: Not on file    Attends meetings of clubs or organizations: Not on file    Relationship status: Not on file  . Intimate partner violence:    Fear of current or ex partner: Not on file    Emotionally abused: Not on file    Physically abused: Not on file    Forced sexual activity: Not on file  Other Topics Concern  . Not on file  Social History Narrative  . Not on file      Review of Systems  Constitutional: Negative for activity change, chills, fatigue and unexpected weight change.  HENT: Negative for congestion, postnasal drip, rhinorrhea, sneezing and sore throat.   Eyes: Negative.   Negative for redness.  Respiratory: Negative for cough, chest tightness and shortness of breath.   Cardiovascular: Positive for palpitations. Negative for chest pain.  Gastrointestinal: Positive for abdominal pain and diarrhea. Negative for constipation, nausea and vomiting.  Endocrine: Negative for cold intolerance, heat intolerance, polydipsia, polyphagia and polyuria.       Has had stable thyroid panel over past several years.   Musculoskeletal: Positive for arthralgias. Negative for back pain, joint swelling and neck pain.  Skin: Negative for rash.  Allergic/Immunologic: Negative for environmental allergies.  Neurological: Negative for tremors and numbness.       Memory issues. Has episodes of time she has no memory or recollection of.   Hematological: Negative for adenopathy. Does not bruise/bleed easily.  Psychiatric/Behavioral: Positive for decreased concentration. Negative for behavioral problems (Depression), sleep disturbance and suicidal ideas. The patient is nervous/anxious.     Today's Vitals   05/11/18 1405  BP: 107/63  Pulse: (!) 54  Resp: 16  SpO2: 100%  Weight: 105 lb (47.6 kg)  Height: 5\' 4"  (1.626 m)    Physical Exam  Constitutional: She is oriented to person, place, and time. She appears well-developed and well-nourished. No distress.  HENT:  Head: Normocephalic and atraumatic.  Mouth/Throat: No oropharyngeal exudate.  Eyes: Pupils are equal, round, and reactive to light. EOM are normal.  Neck: Normal range of motion. Neck supple. No JVD present. No tracheal deviation present. No thyromegaly present.  Cardiovascular: Normal rate, regular rhythm and normal heart sounds. Exam reveals no gallop and no friction rub.  No murmur heard. Pulmonary/Chest: Effort normal and breath sounds normal. No respiratory distress. She has no wheezes. She has no rales. She exhibits no tenderness.  Abdominal: Soft. Bowel sounds are increased. There is no tenderness.  Musculoskeletal:  Normal range of motion.  Lymphadenopathy:    She has no cervical adenopathy.  Neurological: She is alert and oriented to person, place, and time. No cranial nerve deficit.  The patient is at her neurological baseline  Skin: Skin is warm and dry. She is not diaphoretic.  Psychiatric: Her speech is normal and behavior is normal. Judgment and thought content normal. Her mood appears anxious. She exhibits abnormal recent memory.  Nursing note and vitals reviewed.  Assessment/Plan: 1. Gallbladder sludge Patient has made dietary changes suggested by surgeon to reduce amount of gallbladder sludge. Still having some upper abdominal pain and diarrhea. Will repeat RUQ ultrasound for further evaluation. Refer back to surgery sa indicated.  - US Abdomen Limited RUQ; Future  2. Generalized abdominal tenderness with rebound tenderness - US Abdomen Limited RUQ; Future  3. Memory loss Will get new referral to neurology for further evaluation and treatment.  - Ambulatory referral  to Neurology  4. Acquired hypothyroidism Stable. Continue levothyroxine as prescribed.   General Counseling: maritsa hunsucker understanding of the findings of todays visit and agrees with plan of treatment. I have discussed any further diagnostic evaluation that may be needed or ordered today. We also reviewed her medications today. she has been encouraged to call the office with any questions or concerns that should arise related to todays visit.    Orders Placed This Encounter  Procedures  . US Abdomen Limited RUQ  . Ambulatory referral to Neurology    This patient was seen by Leretha Pol FNP Collaboration with Dr Lavera Guise as a part of collaborative care agreement   Time spent: 25 Minutes      Dr Lavera Guise Internal medicine

## 2018-06-03 ENCOUNTER — Ambulatory Visit (INDEPENDENT_AMBULATORY_CARE_PROVIDER_SITE_OTHER): Payer: BLUE CROSS/BLUE SHIELD

## 2018-06-03 DIAGNOSIS — R10827 Generalized rebound abdominal tenderness: Secondary | ICD-10-CM

## 2018-06-03 DIAGNOSIS — K828 Other specified diseases of gallbladder: Secondary | ICD-10-CM

## 2018-06-07 DIAGNOSIS — H5210 Myopia, unspecified eye: Secondary | ICD-10-CM | POA: Diagnosis not present

## 2018-06-07 DIAGNOSIS — H02883 Meibomian gland dysfunction of right eye, unspecified eyelid: Secondary | ICD-10-CM | POA: Diagnosis not present

## 2018-06-07 DIAGNOSIS — H40023 Open angle with borderline findings, high risk, bilateral: Secondary | ICD-10-CM | POA: Diagnosis not present

## 2018-06-07 DIAGNOSIS — H2513 Age-related nuclear cataract, bilateral: Secondary | ICD-10-CM | POA: Diagnosis not present

## 2018-06-08 ENCOUNTER — Telehealth: Payer: Self-pay | Admitting: Nurse Practitioner

## 2018-06-08 NOTE — Telephone Encounter (Signed)
Patient called asking about her ultrasound results. Explained to patient that we will let her know as soon as it is reviewed

## 2018-06-08 NOTE — Telephone Encounter (Signed)
Thank you. Results are not back yet. Just checked

## 2018-06-09 NOTE — Telephone Encounter (Signed)
Patient advised on making appointment to come in for ultrasound results

## 2018-06-09 NOTE — Progress Notes (Signed)
Patient needs to be seen to discuss results of ultrasound.

## 2018-06-16 ENCOUNTER — Encounter: Payer: Self-pay | Admitting: Nurse Practitioner

## 2018-06-16 ENCOUNTER — Ambulatory Visit: Payer: BLUE CROSS/BLUE SHIELD | Admitting: Nurse Practitioner

## 2018-06-16 VITALS — BP 105/42 | HR 55 | Resp 16 | Ht 64.0 in | Wt 103.6 lb

## 2018-06-16 DIAGNOSIS — E039 Hypothyroidism, unspecified: Secondary | ICD-10-CM | POA: Diagnosis not present

## 2018-06-16 DIAGNOSIS — K828 Other specified diseases of gallbladder: Secondary | ICD-10-CM | POA: Diagnosis not present

## 2018-06-16 DIAGNOSIS — I7300 Raynaud's syndrome without gangrene: Secondary | ICD-10-CM | POA: Diagnosis not present

## 2018-06-16 DIAGNOSIS — D376 Neoplasm of uncertain behavior of liver, gallbladder and bile ducts: Secondary | ICD-10-CM

## 2018-06-16 NOTE — Progress Notes (Signed)
[facility] [place], [place] [address]  Internal MEDICINE  Office Visit Note  Patient Name: Regina Irwin  [id number]  [id number]  Date of Service: 06/16/2018  Chief Complaint  Patient presents with  . Follow-up    abdominal ultrasound results    The patient is here for follow up visit. She had an ultrasound of the abdomen for further evaluation of mild gallbladder sludge seen on ultrasound in 10/2017. The new ultrasound does not show gallbladder sludge. It does show lesion on the liver, not noted on previous exam. Lesion is 1.8X1.7X2.1 cm in diameter on the right lobe of the liver, close to the diaphragm. She is not having pain or new problems with her abdomen. No new nausea, vomiting, or diarrhea. States that she has been feeling well and has actually been tolerating foods better. There have been studies in the past, showing hepatic hemangiomas. She did see GI specialist who recommended observation. The most recent studies have not shown these lesions.       Current Medication: Outpatient Encounter Medications as of 06/16/2018  Medication Sig  . Cholecalciferol (VITAMIN D3) 1000 units CAPS Take by mouth.  . famotidine (PEPCID) 20 MG tablet Take 20 mg by mouth at bedtime.  Marland Kitchen levothyroxine (SYNTHROID, LEVOTHROID) 75 MCG tablet Take 1 tablet (75 mcg total) by mouth daily before breakfast.  . metoCLOPramide (REGLAN) 5 MG tablet Take 1 tablet (5 mg total) by mouth at bedtime.  . Multiple Vitamin (MULTI-VITAMINS) TABS Take by mouth.   No facility-administered encounter medications on file as of 06/16/2018.     Surgical History: Past Surgical History:  Procedure Laterality Date  . tumor removed  right foot      Medical History: Past Medical History:  Diagnosis Date  . GERD (gastroesophageal reflux disease)   . Thyroid disease     Family History: Family History  Problem Relation Age of Onset  . Diabetes Father   . Lung cancer Father   . Drug  abuse Paternal Grandmother   . Drug abuse Paternal Grandfather   . Gallstones Mother   . Melanoma Mother   . Melanoma Brother   . Cancer Maternal Grandmother     Social History   Socioeconomic History  . Marital status: Single    Spouse name: Not on file  . Number of children: Not on file  . Years of education: Not on file  . Highest education level: Not on file  Occupational History  . Not on file  Social Needs  . Financial resource strain: Not on file  . Food insecurity:    Worry: Not on file    Inability: Not on file  . Transportation needs:    Medical: Not on file    Non-medical: Not on file  Tobacco Use  . Smoking status: Never Smoker  . Smokeless tobacco: Never Used  Substance and Sexual Activity  . Alcohol use: Never    Frequency: Never  . Drug use: Never  . Sexual activity: Not on file  Lifestyle  . Physical activity:    Days per week: Not on file    Minutes per session: Not on file  . Stress: Not on file  Relationships  . Social connections:    Talks on phone: Not on file    Gets together: Not on file    Attends religious service: Not on file    Active member of club or organization: Not on file    Attends meetings of clubs or  organizations: Not on file    Relationship status: Not on file  . Intimate partner violence:    Fear of current or ex partner: Not on file    Emotionally abused: Not on file    Physically abused: Not on file    Forced sexual activity: Not on file  Other Topics Concern  . Not on file  Social History Narrative  . Not on file      Review of Systems  Constitutional: Negative for activity change, chills, fatigue and unexpected weight change.  HENT: Negative for congestion, postnasal drip, rhinorrhea, sneezing and sore throat.   Eyes: Negative.   Respiratory: Negative for cough, chest tightness and shortness of breath.   Cardiovascular: Positive for palpitations. Negative for chest pain.  Gastrointestinal: Positive for  abdominal pain and diarrhea. Negative for constipation, nausea and vomiting.       Improved GI symptoms   Endocrine: Negative for cold intolerance, heat intolerance, polydipsia and polyuria.       Has had stable thyroid panel over past several years.   Musculoskeletal: Positive for arthralgias. Negative for back pain, joint swelling and neck pain.  Skin: Negative for rash.  Allergic/Immunologic: Negative for environmental allergies.  Neurological: Negative for dizziness, tremors, numbness and headaches.  Hematological: Negative for adenopathy. Does not bruise/bleed easily.  Psychiatric/Behavioral: Positive for decreased concentration. Negative for behavioral problems (Depression), sleep disturbance and suicidal ideas. The patient is nervous/anxious.     Today's Vitals   06/16/18 1026  BP: (!) 105/42  Pulse: (!) 55  Resp: 16  SpO2: 99%  Weight: 103 lb 9.6 oz (47 kg)  Height: 5\' 4"  (1.626 m)  Body mass index is 17.78 kg/m.  Physical Exam Vitals signs and nursing note reviewed.  Constitutional:      General: She is not in acute distress.    Appearance: Normal appearance. She is well-developed. She is not diaphoretic.  HENT:     Head: Normocephalic and atraumatic.     Mouth/Throat:     Pharynx: No oropharyngeal exudate.  Eyes:     Extraocular Movements: Extraocular movements intact.     Pupils: Pupils are equal, round, and reactive to light.  Neck:     Musculoskeletal: Normal range of motion and neck supple.     Thyroid: No thyromegaly.     Vascular: No JVD.     Trachea: No tracheal deviation.  Cardiovascular:     Rate and Rhythm: Normal rate and regular rhythm.     Heart sounds: Normal heart sounds. No murmur. No friction rub. No gallop.   Pulmonary:     Effort: Pulmonary effort is normal. No respiratory distress.     Breath sounds: Normal breath sounds. No wheezing or rales.  Chest:     Chest wall: No tenderness.  Abdominal:     General: Bowel sounds are increased.      Palpations: Abdomen is soft.     Tenderness: There is generalized abdominal tenderness.  Musculoskeletal: Normal range of motion.  Lymphadenopathy:     Cervical: No cervical adenopathy.  Skin:    General: Skin is warm and dry.  Neurological:     General: No focal deficit present.     Mental Status: She is alert and oriented to person, place, and time.     Cranial Nerves: No cranial nerve deficit.     Comments: The patient is at her neurological baseline  Psychiatric:        Mood and Affect: Mood is anxious.  Speech: Speech normal.        Behavior: Behavior normal.        Thought Content: Thought content normal.        Cognition and Memory: She exhibits impaired recent memory.        Judgment: Judgment normal.   Assessment/Plan: 1. Neoplasm of uncertain behavior of liver Recent ultrasound of RUQ of abdomen showing lesion on right lobe of liver 1.8X1.7X2.1 cm in diameter. Will get CT of abdomen and pelvis with oral and IV contrast for further evaluation and treatment. Refer/treat as indicated.  - CT Abdomen Pelvis W Contrast; Future  2. Gallbladder sludge Repeat ultrasound did not show presence of gallbladder sludge.   3. Acquired hypothyroidism Stable.   4. Raynaud's phenomenon without gangrene Discussed lifestyle and behavioral modifications to help reduce uncomfortable symptoms.   General Counseling: shaterria sager understanding of the findings of todays visit and agrees with plan of treatment. I have discussed any further diagnostic evaluation that may be needed or ordered today. We also reviewed her medications today. she has been encouraged to call the office with any questions or concerns that should arise related to todays visit.  This patient was seen by Leretha Pol FNP Collaboration with Dr Lavera Guise as a part of collaborative care agreement  Orders Placed This Encounter  Procedures  . CT Abdomen Pelvis W Contrast     Time spent: 25  Minutes      Dr Lavera Guise Internal medicine

## 2018-06-28 ENCOUNTER — Ambulatory Visit
Admission: RE | Admit: 2018-06-28 | Discharge: 2018-06-28 | Disposition: A | Discharge status: Home / Self Care | Payer: BLUE CROSS/BLUE SHIELD | Source: Ambulatory Visit | Attending: Nurse Practitioner | Admitting: Nurse Practitioner

## 2018-06-28 DIAGNOSIS — D376 Neoplasm of uncertain behavior of liver, gallbladder and bile ducts: Secondary | ICD-10-CM

## 2018-06-28 DIAGNOSIS — K824 Cholesterolosis of gallbladder: Secondary | ICD-10-CM | POA: Diagnosis not present

## 2018-06-28 LAB — POCT I-STAT CREATININE: Creatinine, Ser: 0.9 mg/dL (ref 0.44–1.00)

## 2018-06-28 MED ORDER — IOPAMIDOL (ISOVUE-300) INJECTION 61%
60.0000 mL | Freq: Once | INTRAVENOUS | Status: AC | PRN
  Administered 2018-06-28: 60 mL via INTRAVENOUS

## 2018-07-04 ENCOUNTER — Telehealth: Payer: Self-pay

## 2018-07-04 NOTE — Telephone Encounter (Signed)
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 We can continue to monitor every year.

## 2018-07-08 ENCOUNTER — Telehealth: Payer: Self-pay

## 2018-07-08 NOTE — Telephone Encounter (Signed)
PT CALLED BACK TODAY AND WAS NOTFIED.

## 2018-07-15 NOTE — Telephone Encounter (Signed)
BETH NOTIFIED PATIENT.

## 2018-07-29 ENCOUNTER — Other Ambulatory Visit: Payer: Self-pay

## 2018-07-29 DIAGNOSIS — E039 Hypothyroidism, unspecified: Secondary | ICD-10-CM

## 2018-07-29 MED ORDER — LEVOTHYROXINE SODIUM 75 MCG PO TABS: 75.0000 ug | ORAL_TABLET | Freq: Every day | ORAL | 5 refills | Status: DC

## 2018-11-15 ENCOUNTER — Encounter: Payer: Self-pay | Admitting: Nurse Practitioner

## 2018-11-15 ENCOUNTER — Ambulatory Visit: Payer: BLUE CROSS/BLUE SHIELD | Admitting: Nurse Practitioner

## 2018-11-15 ENCOUNTER — Other Ambulatory Visit: Payer: Self-pay

## 2018-11-15 VITALS — BP 115/65 | HR 59 | Ht 64.0 in | Wt 101.0 lb

## 2018-11-15 DIAGNOSIS — R10827 Generalized rebound abdominal tenderness: Secondary | ICD-10-CM

## 2018-11-15 DIAGNOSIS — R5383 Other fatigue: Secondary | ICD-10-CM | POA: Diagnosis not present

## 2018-11-15 DIAGNOSIS — E039 Hypothyroidism, unspecified: Secondary | ICD-10-CM

## 2018-11-15 NOTE — Progress Notes (Signed)
[facility] [place], [place] [address]  Internal MEDICINE  Telephone Visit  Patient Name: Regina Irwin  [id number]  [id number]  Date of Service: 11/15/2018  I connected with the patient at 11:46sm  by telephone and verified the patients identity using two identifiers.   I discussed the limitations, risks, security and privacy concerns of performing an evaluation and management service by telephone and the availability of in person appointments. I also discussed with the patient that there may be a patient responsible charge related to the service.  The patient expressed understanding and agrees to proceed.    Chief Complaint  Patient presents with  . Telephone Screen    PHONE VISIT [phone]  . Telephone Assessment  . Medical Management of Chronic Issues    6 month follow up, stomach issues feels a little weaker than she usually is. noticed it a while ago   . Hypothyroidism  . Gastroesophageal Reflux    The patient has been contacted via telephone for follow up visit due to concerns for spread of novel coronavirus. The patient states that she is starting to feels sick with physical exertion. States that she just doesn't feel good after exertion. Is time to check routine, fasting labs,       Current Medication: Outpatient Encounter Medications as of 11/15/2018  Medication Sig  . Ca Phosphate-Cholecalciferol 200-200 MG-UNIT CHEW Chew by mouth.  . Cholecalciferol (VITAMIN D3) 1000 units CAPS Take by mouth.  . famotidine (PEPCID) 20 MG tablet Take 20 mg by mouth at bedtime.  Marland Kitchen levothyroxine (SYNTHROID, LEVOTHROID) 75 MCG tablet Take 1 tablet (75 mcg total) by mouth daily before breakfast.  . Multiple Vitamin (MULTI-VITAMINS) TABS Take by mouth.  . metoCLOPramide (REGLAN) 5 MG tablet Take 1 tablet (5 mg total) by mouth at bedtime. (Patient not taking: Reported on 11/15/2018)  . [DISCONTINUED] Multiple Vitamin (MULTI-VITAMIN DAILY PO) Take 1 tablet by  mouth.   No facility-administered encounter medications on file as of 11/15/2018.     Surgical History: Past Surgical History:  Procedure Laterality Date  . tumor removed  right foot      Medical History: Past Medical History:  Diagnosis Date  . GERD (gastroesophageal reflux disease)   . Thyroid disease     Family History: Family History  Problem Relation Age of Onset  . Diabetes Father   . Lung cancer Father   . Drug abuse Paternal Grandmother   . Drug abuse Paternal Grandfather   . Gallstones Mother   . Melanoma Mother   . Melanoma Brother   . Cancer Maternal Grandmother     Social History   Socioeconomic History  . Marital status: Single    Spouse name: Not on file  . Number of children: Not on file  . Years of education: Not on file  . Highest education level: Not on file  Occupational History  . Not on file  Social Needs  . Financial resource strain: Not on file  . Food insecurity:    Worry: Not on file    Inability: Not on file  . Transportation needs:    Medical: Not on file    Non-medical: Not on file  Tobacco Use  . Smoking status: Never Smoker  . Smokeless tobacco: Never Used  Substance and Sexual Activity  . Alcohol use: Never    Frequency: Never  . Drug use: Never  . Sexual activity: Not on file  Lifestyle  . Physical activity:    Days per  week: Not on file    Minutes per session: Not on file  . Stress: Not on file  Relationships  . Social connections:    Talks on phone: Not on file    Gets together: Not on file    Attends religious service: Not on file    Active member of club or organization: Not on file    Attends meetings of clubs or organizations: Not on file    Relationship status: Not on file  . Intimate partner violence:    Fear of current or ex partner: Not on file    Emotionally abused: Not on file    Physically abused: Not on file    Forced sexual activity: Not on file  Other Topics Concern  . Not on file  Social  History Narrative  . Not on file      Review of Systems  Constitutional: Positive for activity change and fatigue. Negative for chills and unexpected weight change.  HENT: Negative for congestion, postnasal drip, rhinorrhea, sneezing and sore throat.   Respiratory: Negative for cough, chest tightness, shortness of breath and wheezing.   Cardiovascular: Positive for palpitations. Negative for chest pain.  Gastrointestinal: Positive for abdominal pain and diarrhea. Negative for constipation, nausea and vomiting.       GI symptoms wax and wane    Endocrine: Negative for cold intolerance, heat intolerance, polydipsia and polyuria.       Has had stable thyroid panel over past several years.   Musculoskeletal: Positive for arthralgias. Negative for back pain, joint swelling and neck pain.  Skin: Negative for rash.  Allergic/Immunologic: Negative for environmental allergies.  Neurological: Negative for dizziness, tremors, numbness and headaches.  Hematological: Negative for adenopathy. Does not bruise/bleed easily.  Psychiatric/Behavioral: Positive for decreased concentration. Negative for behavioral problems (Depression), sleep disturbance and suicidal ideas. The patient is nervous/anxious.    Today's Vitals   11/15/18 1112  BP: 115/65  Pulse: (!) 59  Weight: 101 lb (45.8 kg)  Height: 5\' 4"  (1.626 m)   Body mass index is 17.34 kg/m.  Observation/Objective:   The patient is alert and oriented. She is pleasant and answers all questions appropriately. Breathing is non-labored. She is in no acute distress at this time.    Assessment/Plan: 1. Acquired hypothyroidism Check thyroid panel and adjust dosing of levothyroxine as indicated. Lab slip to be mailed to the patient .  2. Fatigue, unspecified type Check BMP, CBC, and thyroid panel for further evaluation.   3. Generalized abdominal tenderness with rebound tenderness Continue to take medication for GERD as needed and as  prescribed. GI visit as needed   General Counseling: Regina Irwin verbalizes understanding of the findings of today's phone visit and agrees with plan of treatment. I have discussed any further diagnostic evaluation that may be needed or ordered today. We also reviewed her medications today. she has been encouraged to call the office with any questions or concerns that should arise related to todays visit.  This patient was seen by Leretha Pol FNP Collaboration with Dr Lavera Guise as a part of collaborative care agreement   Time spent: 74 Minutes     Dr Lavera Guise Internal medicine

## 2018-11-21 ENCOUNTER — Telehealth: Payer: Self-pay

## 2018-11-21 NOTE — Telephone Encounter (Signed)
Pt called for after visit summary about labslip and how to take her multivitamin as per take how she is taking

## 2018-11-28 ENCOUNTER — Other Ambulatory Visit: Payer: Self-pay | Admitting: Nurse Practitioner

## 2018-11-28 DIAGNOSIS — R5383 Other fatigue: Secondary | ICD-10-CM | POA: Diagnosis not present

## 2018-11-28 DIAGNOSIS — E039 Hypothyroidism, unspecified: Secondary | ICD-10-CM | POA: Diagnosis not present

## 2018-11-28 DIAGNOSIS — E559 Vitamin D deficiency, unspecified: Secondary | ICD-10-CM | POA: Diagnosis not present

## 2018-11-29 LAB — LIPID PANEL W/O CHOL/HDL RATIO
Cholesterol, Total: 159 mg/dL (ref 100–199)
HDL: 59 mg/dL (ref >39)
LDL Calculated: 82 mg/dL (ref 0–99)
Triglycerides: 92 mg/dL (ref 0–149)
VLDL Cholesterol Cal: 18 mg/dL (ref 5–40)

## 2018-11-29 LAB — CBC
Hematocrit: 42.3 % (ref 34.0–46.6)
Hemoglobin: 14.2 g/dL (ref 11.1–15.9)
MCH: 30.6 pg (ref 26.6–33.0)
MCHC: 33.6 g/dL (ref 31.5–35.7)
MCV: 91 fL (ref 79–97)
Platelets: 237 10*3/uL (ref 150–450)
RBC: 4.64 x10E6/uL (ref 3.77–5.28)
RDW: 11.8 % (ref 11.7–15.4)
WBC: 3.9 10*3/uL (ref 3.4–10.8)

## 2018-11-29 LAB — BASIC METABOLIC PANEL
BUN/Creatinine Ratio: 22 (ref 12–28)
BUN: 16 mg/dL (ref 8–27)
CO2: 25 mmol/L (ref 20–29)
Calcium: 9.7 mg/dL (ref 8.7–10.3)
Chloride: 102 mmol/L (ref 96–106)
Creatinine, Ser: 0.74 mg/dL (ref 0.57–1.00)
GFR calc Af Amer: 98 mL/min/{1.73_m2} (ref >59)
GFR calc non Af Amer: 85 mL/min/{1.73_m2} (ref >59)
Glucose: 75 mg/dL (ref 65–99)
Potassium: 4.3 mmol/L (ref 3.5–5.2)
Sodium: 142 mmol/L (ref 134–144)

## 2018-11-29 LAB — VITAMIN D 25 HYDROXY (VIT D DEFICIENCY, FRACTURES): Vit D, 25-Hydroxy: 38.1 ng/mL (ref 30.0–100.0)

## 2018-11-29 LAB — TSH: TSH: 0.047 u[IU]/mL — ABNORMAL LOW (ref 0.450–4.500)

## 2018-11-29 LAB — T3: T3, Total: 84 ng/dL (ref 71–180)

## 2018-11-29 LAB — T4, FREE: Free T4: 1.68 ng/dL (ref 0.82–1.77)

## 2018-12-02 ENCOUNTER — Telehealth: Payer: Self-pay

## 2018-12-02 NOTE — Telephone Encounter (Signed)
Pt advised for labs looks good

## 2018-12-02 NOTE — Telephone Encounter (Signed)
-----   Message from Ronnell Freshwater, NP sent at 12/01/2018  8:51 AM EDT ----- Please let the patient know that her labs look good. Thanks.

## 2018-12-21 DIAGNOSIS — H2513 Age-related nuclear cataract, bilateral: Secondary | ICD-10-CM | POA: Diagnosis not present

## 2018-12-21 DIAGNOSIS — H5210 Myopia, unspecified eye: Secondary | ICD-10-CM | POA: Diagnosis not present

## 2018-12-21 DIAGNOSIS — H401131 Primary open-angle glaucoma, bilateral, mild stage: Secondary | ICD-10-CM | POA: Diagnosis not present

## 2018-12-21 DIAGNOSIS — H04123 Dry eye syndrome of bilateral lacrimal glands: Secondary | ICD-10-CM | POA: Diagnosis not present

## 2019-01-26 ENCOUNTER — Other Ambulatory Visit: Payer: Self-pay

## 2019-01-26 DIAGNOSIS — E039 Hypothyroidism, unspecified: Secondary | ICD-10-CM

## 2019-01-26 MED ORDER — LEVOTHYROXINE SODIUM 75 MCG PO TABS: 75.0000 ug | ORAL_TABLET | Freq: Every day | ORAL | 5 refills | Status: DC

## 2019-02-13 ENCOUNTER — Other Ambulatory Visit: Payer: Self-pay

## 2019-02-13 ENCOUNTER — Encounter: Payer: Self-pay | Admitting: Nurse Practitioner

## 2019-02-13 ENCOUNTER — Ambulatory Visit (INDEPENDENT_AMBULATORY_CARE_PROVIDER_SITE_OTHER): Payer: BLUE CROSS/BLUE SHIELD | Admitting: Nurse Practitioner

## 2019-02-13 VITALS — BP 103/45 | HR 53 | Resp 16 | Ht 64.0 in | Wt 101.0 lb

## 2019-02-13 DIAGNOSIS — K219 Gastro-esophageal reflux disease without esophagitis: Secondary | ICD-10-CM

## 2019-02-13 DIAGNOSIS — Z0001 Encounter for general adult medical examination with abnormal findings: Secondary | ICD-10-CM

## 2019-02-13 DIAGNOSIS — Z1239 Encounter for other screening for malignant neoplasm of breast: Secondary | ICD-10-CM

## 2019-02-13 DIAGNOSIS — E039 Hypothyroidism, unspecified: Secondary | ICD-10-CM | POA: Diagnosis not present

## 2019-02-13 DIAGNOSIS — R300 Dysuria: Secondary | ICD-10-CM | POA: Diagnosis not present

## 2019-02-13 MED ORDER — LEVOTHYROXINE SODIUM 75 MCG PO TABS: 75.0000 ug | ORAL_TABLET | Freq: Every day | ORAL | 5 refills | Status: DC

## 2019-02-13 NOTE — Progress Notes (Signed)
[facility] [place], [place] [address]  Internal MEDICINE  Office Visit Note  Patient Name: Regina Irwin  [id number]  [id number]  Date of Service: 02/13/2019   Pt is here for routine health maintenance examination  Chief Complaint  Patient presents with  . Hypothyroidism  . Gastroesophageal Reflux     The patient is here for health maintenance exam. She had routine, fasting labs done in June, 2020. TSH is leaning toward hypothyroid. Free t4 and t3 are both within normal limits. All other labs are good. She has had some leg weakness from time to time. If she gets down on the floor, she has to have help to get up or use her arms to help her get up. She does state that the more sleep she gets, the better she feels, overall. She has some joint pain in hands and fingers, intermittently. Overall, she is doing well.     Current Medication: Outpatient Encounter Medications as of 02/13/2019  Medication Sig  . Ca Phosphate-Cholecalciferol 200-200 MG-UNIT CHEW Chew by mouth.  . Cholecalciferol (VITAMIN D3) 1000 units CAPS Take by mouth.  . levothyroxine (SYNTHROID) 75 MCG tablet Take 1 tablet (75 mcg total) by mouth daily before breakfast.  . Multiple Vitamin (MULTI-VITAMINS) TABS Take by mouth.  . [DISCONTINUED] levothyroxine (SYNTHROID) 75 MCG tablet Take 1 tablet (75 mcg total) by mouth daily before breakfast.  . [DISCONTINUED] famotidine (PEPCID) 20 MG tablet Take 20 mg by mouth at bedtime.   No facility-administered encounter medications on file as of 02/13/2019.     Surgical History: Past Surgical History:  Procedure Laterality Date  . tumor removed  right foot      Medical History: Past Medical History:  Diagnosis Date  . GERD (gastroesophageal reflux disease)   . Thyroid disease     Family History: Family History  Problem Relation Age of Onset  . Diabetes Father   . Lung cancer Father   . Drug abuse Paternal Grandmother   . Drug abuse  Paternal Grandfather   . Gallstones Mother   . Melanoma Mother   . Melanoma Brother   . Cancer Maternal Grandmother       Review of Systems  Constitutional: Positive for fatigue. Negative for activity change, chills and unexpected weight change.  HENT: Negative for congestion, postnasal drip, rhinorrhea, sneezing and sore throat.   Respiratory: Negative for cough, chest tightness, shortness of breath and wheezing.   Cardiovascular: Positive for palpitations. Negative for chest pain.  Gastrointestinal: Positive for abdominal pain and diarrhea. Negative for constipation, nausea and vomiting.       GI symptoms wax and wane   States that about a month ago, she ate some yogurt, and since then has been having looser stools than usual.   Endocrine: Negative for cold intolerance, heat intolerance, polydipsia and polyuria.       Has had stable thyroid panel over past several years. TSH indicates hyperthyroid, however, Free T4 and T3 are both within normal limits.   Musculoskeletal: Positive for arthralgias. Negative for back pain, joint swelling and neck pain.  Skin: Negative for rash.  Allergic/Immunologic: Negative for environmental allergies.  Neurological: Negative for dizziness, tremors, numbness and headaches.  Hematological: Negative for adenopathy. Does not bruise/bleed easily.  Psychiatric/Behavioral: Positive for decreased concentration. Negative for behavioral problems (Depression), sleep disturbance and suicidal ideas. The patient is nervous/anxious.    Today's Vitals   02/13/19 1444  BP: (!) 103/45  Pulse: (!) 53  Resp: 16  SpO2: 98%  Weight: 101 lb (45.8 kg)  Height: 5\' 4"  (1.626 m)   Body mass index is 17.34 kg/m.  Physical Exam Vitals signs and nursing note reviewed.  Constitutional:      General: She is not in acute distress.    Appearance: Normal appearance. She is well-developed. She is not diaphoretic.  HENT:     Head: Normocephalic and atraumatic.      Mouth/Throat:     Pharynx: No oropharyngeal exudate.  Eyes:     Extraocular Movements: Extraocular movements intact.     Pupils: Pupils are equal, round, and reactive to light.  Neck:     Musculoskeletal: Normal range of motion and neck supple.     Thyroid: No thyromegaly.     Vascular: No JVD.     Trachea: No tracheal deviation.  Cardiovascular:     Rate and Rhythm: Normal rate and regular rhythm.     Heart sounds: Normal heart sounds. No murmur. No friction rub. No gallop.   Pulmonary:     Effort: Pulmonary effort is normal. No respiratory distress.     Breath sounds: Normal breath sounds. No wheezing or rales.  Chest:     Chest wall: No tenderness.     Breasts:        Right: Normal. No swelling, bleeding, inverted nipple, mass, nipple discharge, skin change or tenderness.        Left: Normal. No swelling, bleeding, inverted nipple, mass, nipple discharge, skin change or tenderness.  Abdominal:     General: Bowel sounds are increased.     Palpations: Abdomen is soft.     Tenderness: There is generalized abdominal tenderness.  Musculoskeletal: Normal range of motion.  Lymphadenopathy:     Cervical: No cervical adenopathy.  Skin:    General: Skin is warm and dry.  Neurological:     General: No focal deficit present.     Mental Status: She is alert and oriented to person, place, and time.     Cranial Nerves: No cranial nerve deficit.     Comments: The patient is at her neurological baseline  Psychiatric:        Mood and Affect: Mood is anxious.        Speech: Speech normal.        Behavior: Behavior normal.        Thought Content: Thought content normal.        Cognition and Memory: She exhibits impaired recent memory.        Judgment: Judgment normal.      LABS: Recent Results (from the past 2160 hour(s))  CBC     Status: None   Collection Time: 11/28/18  8:14 AM  Result Value Ref Range   WBC 3.9 3.4 - 10.8 x10E3/uL   RBC 4.64 3.77 - 5.28 x10E6/uL   Hemoglobin  14.2 11.1 - 15.9 g/dL   Hematocrit 42.3 34.0 - 46.6 %   MCV 91 79 - 97 fL   MCH 30.6 26.6 - 33.0 pg   MCHC 33.6 31.5 - 35.7 g/dL   RDW 11.8 11.7 - 15.4 %   Platelets 237 150 - 450 [id number]  Basic metabolic panel     Status: None   Collection Time: 11/28/18  8:14 AM  Result Value Ref Range   Glucose 75 65 - 99 mg/dL   BUN 16 8 - 27 mg/dL   Creatinine, Ser 0.74 0.57 - 1.00 mg/dL   GFR calc non Af Amer 85 >59 mL/min/1.73  GFR calc Af Amer 98 >59 mL/min/1.73   BUN/Creatinine Ratio 22 12 - 28   Sodium 142 134 - 144 mmol/L   Potassium 4.3 3.5 - 5.2 mmol/L   Chloride 102 96 - 106 mmol/L   CO2 25 20 - 29 mmol/L   Calcium 9.7 8.7 - 10.3 mg/dL  Lipid Panel w/o Chol/HDL Ratio     Status: None   Collection Time: 11/28/18  8:14 AM  Result Value Ref Range   Cholesterol, Total 159 100 - 199 mg/dL   Triglycerides 92 0 - 149 mg/dL   HDL 59 >39 mg/dL   VLDL Cholesterol Cal 18 5 - 40 mg/dL   LDL Calculated 82 0 - 99 mg/dL  T4, free     Status: None   Collection Time: 11/28/18  8:14 AM  Result Value Ref Range   Free T4 1.68 0.82 - 1.77 ng/dL  TSH     Status: Abnormal   Collection Time: 11/28/18  8:14 AM  Result Value Ref Range   TSH 0.047 (L) 0.450 - 4.500 uIU/mL  VITAMIN D 25 Hydroxy (Vit-D Deficiency, Fractures)     Status: None   Collection Time: 11/28/18  8:14 AM  Result Value Ref Range   Vit D, 25-Hydroxy 38.1 30.0 - 100.0 ng/mL    Comment: Vitamin D deficiency has been defined by the [place] practice guideline as a level of serum 25-OH vitamin D less than 20 ng/mL (1,2). The Endocrine Society went on to further define vitamin D insufficiency as a level between 21 and 29 ng/mL (2). 1. IOM (Institute of Medicine). 2010. Dietary reference    intakes for calcium and D. [place]: The    [employer]. 2. Holick MF, Binkley Lewellen, Bischoff-Ferrari HA, et al.    Evaluation, treatment, and prevention of vitamin D    deficiency: an  Endocrine Society clinical practice    guideline. JCEM. 2011 Jul; 96(7):1911-30.   T3     Status: None   Collection Time: 11/28/18  8:14 AM  Result Value Ref Range   T3, Total 84 71 - 180 ng/dL   Assessment/Plan: 1. Encounter for general adult medical examination with abnormal findings Annual health maintenance exam.   2. Acquired hypothyroidism Recent thyroid panel leaning toward hyperthyroid. Free T4 and T3 both within normal limits. Continue levothyroxine as prescribed. - levothyroxine (SYNTHROID) 75 MCG tablet; Take 1 tablet (75 mcg total) by mouth daily before breakfast.  Dispense: 30 tablet; Refill: 5  3. Gastroesophageal reflux disease, esophagitis presence not specified Continue pepcid as needed and as prescribed   4. Screening for breast cancer - MM DIGITAL SCREENING BILATERAL; Future  5. Dysuria - Urinalysis, Routine w reflex microscopic  General Counseling: Chicquita verbalizes understanding of the findings of todays visit and agrees with plan of treatment. I have discussed any further diagnostic evaluation that may be needed or ordered today. We also reviewed her medications today. she has been encouraged to call the office with any questions or concerns that should arise related to todays visit.    Counseling:  This patient was seen by Leretha Pol FNP Collaboration with Dr Lavera Guise as a part of collaborative care agreement  Orders Placed This Encounter  Procedures  . MM DIGITAL SCREENING BILATERAL  . Urinalysis, Routine w reflex microscopic    Meds ordered this encounter  Medications  . levothyroxine (SYNTHROID) 75 MCG tablet    Sig: Take 1 tablet (75 mcg total) by mouth  daily before breakfast.    Dispense:  30 tablet    Refill:  5    Order Specific Question:   Supervising Provider    Answer:   Lavera Guise [id number]    Time spent: [place], MD  Internal Medicine

## 2019-02-14 LAB — URINALYSIS, ROUTINE W REFLEX MICROSCOPIC
Bilirubin, UA: NEGATIVE
Glucose, UA: NEGATIVE
Ketones, UA: NEGATIVE
Leukocytes,UA: NEGATIVE
Nitrite, UA: NEGATIVE
Protein,UA: NEGATIVE
RBC, UA: NEGATIVE
Specific Gravity, UA: 1.017 (ref 1.005–1.030)
Urobilinogen, Ur: 1 mg/dL (ref 0.2–1.0)
pH, UA: 6.5 (ref 5.0–7.5)

## 2019-05-29 DIAGNOSIS — Z20828 Contact with and (suspected) exposure to other viral communicable diseases: Secondary | ICD-10-CM | POA: Diagnosis not present

## 2019-05-29 DIAGNOSIS — R0989 Other specified symptoms and signs involving the circulatory and respiratory systems: Secondary | ICD-10-CM | POA: Diagnosis not present

## 2019-06-06 DIAGNOSIS — H2513 Age-related nuclear cataract, bilateral: Secondary | ICD-10-CM | POA: Diagnosis not present

## 2019-06-06 DIAGNOSIS — H43812 Vitreous degeneration, left eye: Secondary | ICD-10-CM | POA: Diagnosis not present

## 2019-06-06 DIAGNOSIS — H40023 Open angle with borderline findings, high risk, bilateral: Secondary | ICD-10-CM | POA: Diagnosis not present

## 2019-06-06 DIAGNOSIS — H11822 Conjunctivochalasis, left eye: Secondary | ICD-10-CM | POA: Diagnosis not present

## 2019-08-11 ENCOUNTER — Telehealth: Payer: Self-pay

## 2019-08-11 NOTE — Telephone Encounter (Signed)
LMOM FOR PATIENT TO CONFIRM AND SCREEN FOR 08-15-19 OV.

## 2019-08-15 ENCOUNTER — Ambulatory Visit: Payer: BC Managed Care – PPO | Admitting: Nurse Practitioner

## 2019-08-15 ENCOUNTER — Encounter: Payer: Self-pay | Admitting: Nurse Practitioner

## 2019-08-15 ENCOUNTER — Other Ambulatory Visit: Payer: Self-pay

## 2019-08-15 VITALS — BP 120/54 | HR 55 | Temp 96.9°F | Resp 16 | Ht 64.0 in | Wt 110.8 lb

## 2019-08-15 DIAGNOSIS — K529 Noninfective gastroenteritis and colitis, unspecified: Secondary | ICD-10-CM

## 2019-08-15 DIAGNOSIS — E039 Hypothyroidism, unspecified: Secondary | ICD-10-CM | POA: Diagnosis not present

## 2019-08-15 MED ORDER — LEVOTHYROXINE SODIUM 75 MCG PO TABS: 75.0000 ug | ORAL_TABLET | Freq: Every day | ORAL | 5 refills | Status: DC

## 2019-08-15 MED ORDER — CLARITHROMYCIN 500 MG PO TABS: 500.0000 mg | ORAL_TABLET | Freq: Two times a day (BID) | ORAL | 0 refills | Status: DC

## 2019-08-15 NOTE — Progress Notes (Signed)
[facility] [place], [place] [address]  Internal MEDICINE  Office Visit Note  Patient Name: Regina Irwin  [id number]  [id number]  Date of Service: 08/15/2019  Chief Complaint  Patient presents with  . Hypothyroidism    The patient is here for routine follow up. States that she is doing well. Has had both Pfizer COVID 19 vaccines. These are documented in her immunization record. States that she has been having some trouble with gut. Initially started with a yogurt she ate which tasted a little sour. Since then, she has been having frequent abdominal cramping with more frequent, larger, and looser stools than she is used. She states that at one point she was having multiple bowel movements every day. She denies diarrhea. She denies nausea or vomiting, or fever.       Current Medication: Outpatient Encounter Medications as of 08/15/2019  Medication Sig  . Ca Phosphate-Cholecalciferol 200-200 MG-UNIT CHEW Chew by mouth.  . Cholecalciferol (VITAMIN D3) 1000 units CAPS Take by mouth.  . levothyroxine (SYNTHROID) 75 MCG tablet Take 1 tablet (75 mcg total) by mouth daily before breakfast.  . Multiple Vitamin (MULTI-VITAMINS) TABS Take by mouth.  . [DISCONTINUED] levothyroxine (SYNTHROID) 75 MCG tablet Take 1 tablet (75 mcg total) by mouth daily before breakfast.  . clarithromycin (BIAXIN) 500 MG tablet Take 1 tablet (500 mg total) by mouth 2 (two) times daily.   No facility-administered encounter medications on file as of 08/15/2019.    Surgical History: Past Surgical History:  Procedure Laterality Date  . tumor removed  right foot      Medical History: Past Medical History:  Diagnosis Date  . GERD (gastroesophageal reflux disease)   . Thyroid disease     Family History: Family History  Problem Relation Age of Onset  . Diabetes Father   . Lung cancer Father   . Drug abuse Paternal Grandmother   . Drug abuse Paternal Grandfather   . Gallstones  Mother   . Melanoma Mother   . Melanoma Brother   . Cancer Maternal Grandmother     Social History   Socioeconomic History  . Marital status: Single    Spouse name: Not on file  . Number of children: Not on file  . Years of education: Not on file  . Highest education level: Not on file  Occupational History  . Not on file  Tobacco Use  . Smoking status: Never Smoker  . Smokeless tobacco: Never Used  Substance and Sexual Activity  . Alcohol use: Never  . Drug use: Never  . Sexual activity: Not on file  Other Topics Concern  . Not on file  Social History Narrative  . Not on file   Social Determinants of Health   Financial Resource Strain:   . Difficulty of Paying Living Expenses: Not on file  Food Insecurity:   . Worried About Charity fundraiser in the Last Year: Not on file  . Ran Out of Food in the Last Year: Not on file  Transportation Needs:   . Lack of Transportation (Medical): Not on file  . Lack of Transportation (Non-Medical): Not on file  Physical Activity:   . Days of Exercise per Week: Not on file  . Minutes of Exercise per Session: Not on file  Stress:   . Feeling of Stress : Not on file  Social Connections:   . Frequency of Communication with Friends and Family: Not on file  . Frequency of Social Gatherings with  Friends and Family: Not on file  . Attends Religious Services: Not on file  . Active Member of Clubs or Organizations: Not on file  . Attends Archivist Meetings: Not on file  . Marital Status: Not on file  Intimate Partner Violence:   . Fear of Current or Ex-Partner: Not on file  . Emotionally Abused: Not on file  . Physically Abused: Not on file  . Sexually Abused: Not on file      Review of Systems  Constitutional: Negative for activity change, chills, fatigue and unexpected weight change.  HENT: Negative for congestion, postnasal drip, rhinorrhea, sneezing and sore throat.   Respiratory: Negative for cough, chest  tightness, shortness of breath and wheezing.   Cardiovascular: Negative for chest pain and palpitations.  Gastrointestinal: Positive for abdominal pain and diarrhea. Negative for constipation, nausea and vomiting.       Having intermittent abdominal cramping with loose stools. There are some days when she has multiple bowel movements in the same day. This is unusual as she has always been a bit constipated .  Endocrine: Negative for cold intolerance, heat intolerance, polydipsia and polyuria.       Has had stable thyroid panel over past several years. TSH indicates hyperthyroid, however, Free T4 and T3 are both within normal limits.   Musculoskeletal: Positive for arthralgias. Negative for back pain, joint swelling and neck pain.  Skin: Negative for rash.  Allergic/Immunologic: Negative for environmental allergies.  Neurological: Negative for dizziness, tremors, numbness and headaches.  Hematological: Negative for adenopathy. Does not bruise/bleed easily.  Psychiatric/Behavioral: Positive for decreased concentration. Negative for behavioral problems (Depression), sleep disturbance and suicidal ideas. The patient is nervous/anxious.    Today's Vitals   08/15/19 0959  BP: (!) 120/54  Pulse: (!) 55  Resp: 16  Temp: (!) 96.9 F (36.1 C)  SpO2: 98%  Weight: 110 lb 12.8 oz (50.3 kg)   Body mass index is 19.02 kg/m.  Physical Exam Vitals and nursing note reviewed.  Constitutional:      General: She is not in acute distress.    Appearance: Normal appearance. She is well-developed. She is not diaphoretic.  HENT:     Head: Normocephalic and atraumatic.     Nose: Nose normal.     Mouth/Throat:     Pharynx: No oropharyngeal exudate.  Eyes:     Pupils: Pupils are equal, round, and reactive to light.  Neck:     Thyroid: No thyromegaly.     Vascular: No JVD.     Trachea: No tracheal deviation.  Cardiovascular:     Rate and Rhythm: Normal rate and regular rhythm.     Heart sounds: Normal  heart sounds. No murmur. No friction rub. No gallop.   Pulmonary:     Effort: Pulmonary effort is normal. No respiratory distress.     Breath sounds: Normal breath sounds. No wheezing or rales.  Chest:     Chest wall: No tenderness.  Abdominal:     General: Bowel sounds are normal.     Palpations: Abdomen is soft.     Tenderness: There is no abdominal tenderness.  Musculoskeletal:        General: Normal range of motion.     Cervical back: Normal range of motion and neck supple.  Lymphadenopathy:     Cervical: No cervical adenopathy.  Skin:    General: Skin is warm and dry.  Neurological:     Mental Status: She is alert and oriented to person,  place, and time.     Cranial Nerves: No cranial nerve deficit.  Psychiatric:        Mood and Affect: Mood normal.        Behavior: Behavior normal.        Thought Content: Thought content normal.        Judgment: Judgment normal.    Assessment/Plan: 1. Colitis Unclear etiology. Will treat with round of biaxin 500mg  bid for 10 days. Advised her to include daily probiotic. Will reassess at next visit.  - clarithromycin (BIAXIN) 500 MG tablet; Take 1 tablet (500 mg total) by mouth 2 (two) times daily.  Dispense: 20 tablet; Refill: 0  2. Acquired hypothyroidism Continue levothyroxine at current dose.  - levothyroxine (SYNTHROID) 75 MCG tablet; Take 1 tablet (75 mcg total) by mouth daily before breakfast.  Dispense: 30 tablet; Refill: 5  General Counseling: Brande verbalizes understanding of the findings of todays visit and agrees with plan of treatment. I have discussed any further diagnostic evaluation that may be needed or ordered today. We also reviewed her medications today. she has been encouraged to call the office with any questions or concerns that should arise related to todays visit.   This patient was seen by [place] with Dr Lavera Guise as a part of collaborative care agreement  Meds ordered this encounter   Medications  . levothyroxine (SYNTHROID) 75 MCG tablet    Sig: Take 1 tablet (75 mcg total) by mouth daily before breakfast.    Dispense:  30 tablet    Refill:  5    Order Specific Question:   Supervising Provider    Answer:   Lavera Guise [place]  . clarithromycin (BIAXIN) 500 MG tablet    Sig: Take 1 tablet (500 mg total) by mouth 2 (two) times daily.    Dispense:  20 tablet    Refill:  0    Order Specific Question:   Supervising Provider    Answer:   Lavera Guise [id number]    Total time spent: 30 Minutes  Time spent includes review of chart, medications, test results, and follow up plan with the patient.      Dr Lavera Guise Internal medicine

## 2019-12-27 DIAGNOSIS — H5210 Myopia, unspecified eye: Secondary | ICD-10-CM | POA: Diagnosis not present

## 2019-12-27 DIAGNOSIS — H04123 Dry eye syndrome of bilateral lacrimal glands: Secondary | ICD-10-CM | POA: Diagnosis not present

## 2019-12-27 DIAGNOSIS — H2513 Age-related nuclear cataract, bilateral: Secondary | ICD-10-CM | POA: Diagnosis not present

## 2020-01-29 ENCOUNTER — Other Ambulatory Visit: Payer: Self-pay | Admitting: Nurse Practitioner

## 2020-01-29 DIAGNOSIS — E559 Vitamin D deficiency, unspecified: Secondary | ICD-10-CM | POA: Diagnosis not present

## 2020-01-29 DIAGNOSIS — E039 Hypothyroidism, unspecified: Secondary | ICD-10-CM | POA: Diagnosis not present

## 2020-01-29 DIAGNOSIS — Z0001 Encounter for general adult medical examination with abnormal findings: Secondary | ICD-10-CM | POA: Diagnosis not present

## 2020-01-30 LAB — COMPREHENSIVE METABOLIC PANEL
ALT: 17 IU/L (ref 0–32)
AST: 22 IU/L (ref 0–40)
Albumin/Globulin Ratio: 2 (ref 1.2–2.2)
Albumin: 4.2 g/dL (ref 3.8–4.8)
Alkaline Phosphatase: 104 IU/L (ref 48–121)
BUN/Creatinine Ratio: 21 (ref 12–28)
BUN: 18 mg/dL (ref 8–27)
Bilirubin Total: 0.6 mg/dL (ref 0.0–1.2)
CO2: 27 mmol/L (ref 20–29)
Calcium: 9.5 mg/dL (ref 8.7–10.3)
Chloride: 103 mmol/L (ref 96–106)
Creatinine, Ser: 0.85 mg/dL (ref 0.57–1.00)
GFR calc Af Amer: 83 mL/min/{1.73_m2} (ref >59)
GFR calc non Af Amer: 72 mL/min/{1.73_m2} (ref >59)
Globulin, Total: 2.1 g/dL (ref 1.5–4.5)
Glucose: 90 mg/dL (ref 65–99)
Potassium: 4.7 mmol/L (ref 3.5–5.2)
Sodium: 141 mmol/L (ref 134–144)
Total Protein: 6.3 g/dL (ref 6.0–8.5)

## 2020-01-30 LAB — CBC
Hematocrit: 39.3 % (ref 34.0–46.6)
Hemoglobin: 13.3 g/dL (ref 11.1–15.9)
MCH: 30.6 pg (ref 26.6–33.0)
MCHC: 33.8 g/dL (ref 31.5–35.7)
MCV: 91 fL (ref 79–97)
Platelets: 224 10*3/uL (ref 150–450)
RBC: 4.34 x10E6/uL (ref 3.77–5.28)
RDW: 11.5 % — ABNORMAL LOW (ref 11.7–15.4)
WBC: 4.1 10*3/uL (ref 3.4–10.8)

## 2020-01-30 LAB — TSH: TSH: 0.088 u[IU]/mL — ABNORMAL LOW (ref 0.450–4.500)

## 2020-01-30 LAB — LIPID PANEL W/O CHOL/HDL RATIO
Cholesterol, Total: 145 mg/dL (ref 100–199)
HDL: 60 mg/dL (ref >39)
LDL Chol Calc (NIH): 68 mg/dL (ref 0–99)
Triglycerides: 89 mg/dL (ref 0–149)
VLDL Cholesterol Cal: 17 mg/dL (ref 5–40)

## 2020-01-30 LAB — VITAMIN D 25 HYDROXY (VIT D DEFICIENCY, FRACTURES): Vit D, 25-Hydroxy: 33.3 ng/mL (ref 30.0–100.0)

## 2020-01-30 LAB — T4, FREE: Free T4: 1.65 ng/dL (ref 0.82–1.77)

## 2020-01-30 NOTE — Progress Notes (Signed)
Labs reviewed. Discuss with patient at next visit.

## 2020-02-08 ENCOUNTER — Telehealth: Payer: Self-pay

## 2020-02-08 NOTE — Telephone Encounter (Signed)
Confirmed and screened for 02-12-20 ov.

## 2020-02-12 ENCOUNTER — Other Ambulatory Visit: Payer: Self-pay

## 2020-02-12 ENCOUNTER — Ambulatory Visit (INDEPENDENT_AMBULATORY_CARE_PROVIDER_SITE_OTHER): Payer: BC Managed Care – PPO | Admitting: Nurse Practitioner

## 2020-02-12 ENCOUNTER — Encounter: Payer: Self-pay | Admitting: Nurse Practitioner

## 2020-02-12 VITALS — BP 104/55 | HR 55 | Temp 97.6°F | Resp 16 | Ht 64.0 in | Wt 107.4 lb

## 2020-02-12 DIAGNOSIS — Z1231 Encounter for screening mammogram for malignant neoplasm of breast: Secondary | ICD-10-CM

## 2020-02-12 DIAGNOSIS — Z0001 Encounter for general adult medical examination with abnormal findings: Secondary | ICD-10-CM

## 2020-02-12 DIAGNOSIS — K219 Gastro-esophageal reflux disease without esophagitis: Secondary | ICD-10-CM | POA: Diagnosis not present

## 2020-02-12 DIAGNOSIS — E039 Hypothyroidism, unspecified: Secondary | ICD-10-CM | POA: Diagnosis not present

## 2020-02-12 DIAGNOSIS — R300 Dysuria: Secondary | ICD-10-CM | POA: Diagnosis not present

## 2020-02-12 DIAGNOSIS — Z13820 Encounter for screening for osteoporosis: Secondary | ICD-10-CM

## 2020-02-12 NOTE — Progress Notes (Signed)
[facility] [place], [place] [address]  Internal MEDICINE  Office Visit Note  Patient Name: Regina Irwin  [id number]  [id number]  Date of Service: 02/26/2020   Pt is here for routine health maintenance examination  Chief Complaint  Patient presents with  . Annual Exam  . Quality Metric Gaps    PNA, TDAP, HepC, DEXA     The patient presents for health maintenance exam. The patient states that she is doing well. She recently had routine, fasting labs done. Her TSH was slightly low, but her Free T4 was normal. Other labs were normal. She is due to have a screening mammogram and bone density test.     Current Medication: Outpatient Encounter Medications as of 02/12/2020  Medication Sig  . Ca Phosphate-Cholecalciferol 200-200 MG-UNIT CHEW Chew by mouth.  . levothyroxine (SYNTHROID) 75 MCG tablet Take 1 tablet (75 mcg total) by mouth daily before breakfast.  . Multiple Vitamin (MULTI-VITAMINS) TABS Take by mouth.  . Cholecalciferol (VITAMIN D3) 1000 units CAPS Take by mouth. (Patient not taking: Reported on 02/12/2020)  . clarithromycin (BIAXIN) 500 MG tablet Take 1 tablet (500 mg total) by mouth 2 (two) times daily. (Patient not taking: Reported on 02/12/2020)   No facility-administered encounter medications on file as of 02/12/2020.    Surgical History: Past Surgical History:  Procedure Laterality Date  . tumor removed  right foot      Medical History: Past Medical History:  Diagnosis Date  . GERD (gastroesophageal reflux disease)   . Thyroid disease     Family History: Family History  Problem Relation Age of Onset  . Diabetes Father   . Lung cancer Father   . Drug abuse Paternal Grandmother   . Drug abuse Paternal Grandfather   . Gallstones Mother   . Melanoma Mother   . Melanoma Brother   . Cancer Maternal Grandmother       Review of Systems  Constitutional: Negative for activity change, chills, fatigue and unexpected weight  change.  HENT: Negative for congestion, postnasal drip, rhinorrhea, sneezing and sore throat.   Respiratory: Negative for cough, chest tightness and shortness of breath.   Cardiovascular: Negative for chest pain and palpitations.  Gastrointestinal: Negative for abdominal pain, constipation, diarrhea, nausea and vomiting.       Intermittent issues with constipation and GERD.   Endocrine: Negative for cold intolerance, heat intolerance, polydipsia and polyuria.       Recent thyroid panel showing low TSH with normal Free T4.   Genitourinary: Negative for dysuria, frequency, hematuria and urgency.  Musculoskeletal: Negative for arthralgias, back pain, joint swelling and neck pain.  Skin: Negative for rash.  Allergic/Immunologic: Negative for environmental allergies.  Neurological: Negative for dizziness, tremors, numbness and headaches.  Hematological: Negative for adenopathy. Does not bruise/bleed easily.  Psychiatric/Behavioral: Negative for behavioral problems (Depression), sleep disturbance and suicidal ideas. The patient is nervous/anxious.      Today's Vitals   02/12/20 1045  BP: (!) 104/55  Pulse: (!) 55  Resp: 16  Temp: 97.6 F (36.4 C)  SpO2: 98%  Weight: 107 lb 6.4 oz (48.7 kg)  Height: 5' 4"  (1.626 m)   Body mass index is 18.44 kg/m.  Physical Exam Vitals and nursing note reviewed.  Constitutional:      General: She is not in acute distress.    Appearance: Normal appearance. She is well-developed. She is not diaphoretic.  HENT:     Head: Normocephalic and atraumatic.     Nose:  Nose normal.     Mouth/Throat:     Pharynx: No oropharyngeal exudate.  Eyes:     Pupils: Pupils are equal, round, and reactive to light.  Neck:     Thyroid: No thyromegaly.     Vascular: No carotid bruit or JVD.     Trachea: No tracheal deviation.  Cardiovascular:     Rate and Rhythm: Normal rate and regular rhythm.     Pulses: Normal pulses.     Heart sounds: Normal heart sounds. No  murmur heard.  No friction rub. No gallop.   Pulmonary:     Effort: Pulmonary effort is normal. No respiratory distress.     Breath sounds: Normal breath sounds. No wheezing or rales.  Chest:     Chest wall: No tenderness.     Breasts:        Right: Normal. No swelling, bleeding, inverted nipple, mass, nipple discharge, skin change or tenderness.        Left: Normal. No swelling, bleeding, inverted nipple, mass, nipple discharge, skin change or tenderness.  Abdominal:     General: Bowel sounds are normal.     Palpations: Abdomen is soft.     Tenderness: There is no abdominal tenderness.  Musculoskeletal:        General: Normal range of motion.     Cervical back: Normal range of motion and neck supple.  Lymphadenopathy:     Cervical: No cervical adenopathy.     Upper Body:     Right upper body: No axillary adenopathy.     Left upper body: No axillary adenopathy.  Skin:    General: Skin is warm and dry.  Neurological:     General: No focal deficit present.     Mental Status: She is alert and oriented to person, place, and time.     Cranial Nerves: No cranial nerve deficit.  Psychiatric:        Mood and Affect: Mood normal.        Behavior: Behavior normal.        Thought Content: Thought content normal.        Judgment: Judgment normal.      LABS: Recent Results (from the past 2160 hour(s))  Comprehensive metabolic panel     Status: None   Collection Time: 01/29/20  8:44 AM  Result Value Ref Range   Glucose 90 65 - 99 mg/dL   BUN 18 8 - 27 mg/dL   Creatinine, Ser 0.85 0.57 - 1.00 mg/dL   GFR calc non Af Amer 72 >59 mL/min/1.73   GFR calc Af Amer 83 >59 mL/min/1.73    Comment: **[facility] currently reports eGFR in compliance with the current**   recommendations of the [employer]. [facility] will   update reporting as new guidelines are published from the NKF-ASN   Task force.    BUN/Creatinine Ratio 21 12 - 28   Sodium 141 134 - 144 mmol/L   Potassium 4.7  3.5 - 5.2 mmol/L   Chloride 103 96 - 106 mmol/L   CO2 27 20 - 29 mmol/L   Calcium 9.5 8.7 - 10.3 mg/dL   Total Protein 6.3 6.0 - 8.5 g/dL   Albumin 4.2 3.8 - 4.8 g/dL   Globulin, Total 2.1 1.5 - 4.5 g/dL   Albumin/Globulin Ratio 2.0 1.2 - 2.2   Bilirubin Total 0.6 0.0 - 1.2 mg/dL   Alkaline Phosphatase 104 48 - 121 IU/L   AST 22 0 - 40 IU/L   ALT  17 0 - 32 IU/L  CBC     Status: Abnormal   Collection Time: 01/29/20  8:44 AM  Result Value Ref Range   WBC 4.1 3.4 - 10.8 x10E3/uL   RBC 4.34 3.77 - 5.28 x10E6/uL   Hemoglobin 13.3 11.1 - 15.9 g/dL   Hematocrit 39.3 34.0 - 46.6 %   MCV 91 79 - 97 fL   MCH 30.6 26.6 - 33.0 pg   MCHC 33.8 31 - 35 g/dL   RDW 11.5 (L) 11.7 - 15.4 %   Platelets 224 150 - 450 x10E3/uL  Lipid Panel w/o Chol/HDL Ratio     Status: None   Collection Time: 01/29/20  8:44 AM  Result Value Ref Range   Cholesterol, Total 145 100 - 199 mg/dL   Triglycerides 89 0 - 149 mg/dL   HDL 60 >39 mg/dL   VLDL Cholesterol Cal 17 5 - 40 mg/dL   LDL Chol Calc (NIH) 68 0 - 99 mg/dL  T4, free     Status: None   Collection Time: 01/29/20  8:44 AM  Result Value Ref Range   Free T4 1.65 0.82 - 1.77 ng/dL  TSH     Status: Abnormal   Collection Time: 01/29/20  8:44 AM  Result Value Ref Range   TSH 0.088 (L) 0.450 - 4.500 uIU/mL  VITAMIN D 25 Hydroxy (Vit-D Deficiency, Fractures)     Status: None   Collection Time: 01/29/20  8:44 AM  Result Value Ref Range   Vit D, 25-Hydroxy 33.3 30.0 - 100.0 ng/mL    Comment: Vitamin D deficiency has been defined by the [place] and an Endocrine Society practice guideline as a level of serum 25-OH vitamin D less than 20 ng/mL (1,2). The Endocrine Society went on to further define vitamin D insufficiency as a level between 21 and 29 ng/mL (2). 1. IOM (Institute of Medicine). 2010. Dietary reference    intakes for calcium and D. Bureau: The    [employer]. 2. Holick MF, Binkley [place], Bischoff-Ferrari HA, et  al.    Evaluation, treatment, and prevention of vitamin D    deficiency: an Endocrine Society clinical practice    guideline. JCEM. 2011 Jul; 96(7):1911-30.   UA/M w/rflx Culture, Routine     Status: Abnormal   Collection Time: 02/12/20 10:43 AM   Specimen: Urine   Urine  Result Value Ref Range   Specific Gravity, UA 1.016 1.005 - 1.030   pH, UA 7.0 5.0 - 7.5   Color, UA Yellow Yellow   Appearance Ur Clear Clear   Leukocytes,UA 1+ (A) Negative   Protein,UA Negative Negative/Trace   Glucose, UA Negative Negative   Ketones, UA Negative Negative   RBC, UA Negative Negative   Bilirubin, UA Negative Negative   Urobilinogen, Ur 0.2 0.2 - 1.0 mg/dL   Nitrite, UA Negative Negative   Microscopic Examination See below:     Comment: Microscopic was indicated and was performed.   Urinalysis Reflex Comment     Comment: This specimen has reflexed to a Urine Culture.  Microscopic Examination     Status: None   Collection Time: 02/12/20 10:43 AM   Urine  Result Value Ref Range   WBC, UA None seen 0 - 5 /hpf   RBC 0-2 0 - 2 /hpf   Epithelial Cells (non renal) None seen 0 - 10 /hpf   Casts None seen None seen /lpf   Bacteria, UA None seen None seen/Few  Urine Culture,  Reflex     Status: Abnormal   Collection Time: 02/12/20 10:43 AM   Urine  Result Value Ref Range   Urine Culture, Routine Final report (A)    Organism ID, Bacteria Citrobacter koseri (A)     Comment: 50,000-100,000 colony forming units per mL   Antimicrobial Susceptibility Comment     Comment:       ** S = Susceptible; I = Intermediate; R = Resistant **                    P = Positive; N = Negative             MICS are expressed in micrograms per mL    Antibiotic                 RSLT#1    RSLT#2    RSLT#3    RSLT#4 Amoxicillin/Clavulanic Acid    S Cefepime                       S Ceftriaxone                    S Cefuroxime                     S Ciprofloxacin                  S Ertapenem                       S Gentamicin                     S Imipenem                       S Levofloxacin                   S Meropenem                      S Nitrofurantoin                 S Piperacillin/Tazobactam        S Tetracycline                   S Tobramycin                     S Trimethoprim/Sulfa             S     Assessment/Plan: 1. Encounter for general adult medical examination with abnormal findings Annual health maintenance exam today.   2. Acquired hypothyroidism Thyroid panel stable. Continue levothyroxine as prescribed.   3. Gastroesophageal reflux disease without esophagitis Recommended she take pepcid as needed and as indicated for acute symptoms.   4. Encounter for screening mammogram for malignant neoplasm of breast - MM DIGITAL SCREENING BILATERAL; Future  5. Screening for osteoporosis - DG Bone Density; Future  6. Dysuria - UA/M w/rflx Culture, Routine  General Counseling: Jeanna verbalizes understanding of the findings of todays visit and agrees with plan of treatment. I have discussed any further diagnostic evaluation that may be needed or ordered today. We also reviewed her medications today. she has been encouraged to call the office with any questions or concerns that should arise related to todays visit.    Counseling:  This patient was seen by Leretha Pol  FNP Collaboration with Dr Lavera Guise as a part of collaborative care agreement  Orders Placed This Encounter  Procedures  . Microscopic Examination  . Urine Culture, Reflex  . MM DIGITAL SCREENING BILATERAL  . DG Bone Density  . UA/M w/rflx Culture, Routine     Total time spent: 79 Minutes  Time spent includes review of chart, medications, test results, and follow up plan with the patient.     Lavera Guise, MD  Internal Medicine

## 2020-02-16 LAB — URINE CULTURE, REFLEX

## 2020-02-16 LAB — UA/M W/RFLX CULTURE, ROUTINE
Bilirubin, UA: NEGATIVE
Glucose, UA: NEGATIVE
Ketones, UA: NEGATIVE
Nitrite, UA: NEGATIVE
Protein,UA: NEGATIVE
RBC, UA: NEGATIVE
Specific Gravity, UA: 1.016 (ref 1.005–1.030)
Urobilinogen, Ur: 0.2 mg/dL (ref 0.2–1.0)
pH, UA: 7 (ref 5.0–7.5)

## 2020-02-16 LAB — MICROSCOPIC EXAMINATION
Bacteria, UA: NONE SEEN
Casts: NONE SEEN /lpf
Epithelial Cells (non renal): NONE SEEN /hpf (ref 0–10)
WBC, UA: NONE SEEN /hpf (ref 0–5)

## 2020-02-22 ENCOUNTER — Telehealth: Payer: Self-pay

## 2020-02-22 NOTE — Progress Notes (Signed)
LMOM to call us back.

## 2020-02-23 ENCOUNTER — Other Ambulatory Visit: Payer: Self-pay

## 2020-02-23 ENCOUNTER — Telehealth: Payer: Self-pay

## 2020-02-23 DIAGNOSIS — R300 Dysuria: Secondary | ICD-10-CM

## 2020-02-23 MED ORDER — CIPROFLOXACIN HCL 500 MG PO TABS: 500.0000 mg | ORAL_TABLET | Freq: Two times a day (BID) | ORAL | 0 refills | Status: DC

## 2020-02-23 NOTE — Telephone Encounter (Signed)
done

## 2020-02-23 NOTE — Progress Notes (Signed)
Tried to call pt. Lmom advising her of Dr. Laurelyn Sickle instructions.

## 2020-02-23 NOTE — Progress Notes (Signed)
Pt called and informed us she was having urgency. Per Dr. Humphrey Rolls we called in cipro 500 BID 7 days #14, to drink a lot of water, and return to the lab for UA recheck in 2 weeks.

## 2020-02-26 DIAGNOSIS — Z0001 Encounter for general adult medical examination with abnormal findings: Secondary | ICD-10-CM | POA: Insufficient documentation

## 2020-02-26 DIAGNOSIS — Z13820 Encounter for screening for osteoporosis: Secondary | ICD-10-CM | POA: Insufficient documentation

## 2020-02-28 NOTE — Telephone Encounter (Signed)
-----   Message from Lavera Guise, MD sent at 02/22/2020 12:08 PM EDT ----- Please call this pt and find out if she is having any symptoms of UTI

## 2020-02-28 NOTE — Telephone Encounter (Signed)
Spoke with pt she having urine urgency and as per dr we send  cipro and repeat urine in 2 weeks and labs order  In the epic

## 2020-03-07 DIAGNOSIS — R300 Dysuria: Secondary | ICD-10-CM | POA: Diagnosis not present

## 2020-03-08 LAB — UA/M W/RFLX CULTURE, ROUTINE
Bilirubin, UA: NEGATIVE
Glucose, UA: NEGATIVE
Ketones, UA: NEGATIVE
Leukocytes,UA: NEGATIVE
Nitrite, UA: NEGATIVE
Protein,UA: NEGATIVE
RBC, UA: NEGATIVE
Specific Gravity, UA: 1.01 (ref 1.005–1.030)
Urobilinogen, Ur: 0.2 mg/dL (ref 0.2–1.0)
pH, UA: 6.5 (ref 5.0–7.5)

## 2020-03-08 LAB — MICROSCOPIC EXAMINATION
Bacteria, UA: NONE SEEN
Casts: NONE SEEN /lpf
Epithelial Cells (non renal): NONE SEEN /hpf (ref 0–10)
RBC, Urine: NONE SEEN /hpf (ref 0–2)
WBC, UA: NONE SEEN /hpf (ref 0–5)

## 2020-03-11 ENCOUNTER — Other Ambulatory Visit: Payer: Self-pay

## 2020-03-11 DIAGNOSIS — E039 Hypothyroidism, unspecified: Secondary | ICD-10-CM

## 2020-03-11 MED ORDER — LEVOTHYROXINE SODIUM 75 MCG PO TABS: 75.0000 ug | ORAL_TABLET | Freq: Every day | ORAL | 5 refills | Status: DC

## 2020-03-13 ENCOUNTER — Telehealth: Payer: Self-pay

## 2020-03-13 NOTE — Telephone Encounter (Signed)
Faxed requested records to [facility] at [phone]. Copy held at front desk.

## 2020-03-15 ENCOUNTER — Telehealth: Payer: Self-pay

## 2020-03-15 NOTE — Telephone Encounter (Signed)
Called patient to advise that patient needs to call [facility] to schedule mammogram and bone density. Regina Irwin

## 2020-05-21 DIAGNOSIS — H02883 Meibomian gland dysfunction of right eye, unspecified eyelid: Secondary | ICD-10-CM | POA: Diagnosis not present

## 2020-05-21 DIAGNOSIS — H40023 Open angle with borderline findings, high risk, bilateral: Secondary | ICD-10-CM | POA: Diagnosis not present

## 2020-05-21 DIAGNOSIS — H2513 Age-related nuclear cataract, bilateral: Secondary | ICD-10-CM | POA: Diagnosis not present

## 2020-05-21 DIAGNOSIS — H5213 Myopia, bilateral: Secondary | ICD-10-CM | POA: Diagnosis not present

## 2020-08-15 ENCOUNTER — Ambulatory Visit: Payer: BC Managed Care – PPO | Admitting: Physician Assistant

## 2020-08-15 ENCOUNTER — Other Ambulatory Visit: Payer: Self-pay

## 2020-08-15 ENCOUNTER — Encounter: Payer: Self-pay | Admitting: Physician Assistant

## 2020-08-15 DIAGNOSIS — Z1231 Encounter for screening mammogram for malignant neoplasm of breast: Secondary | ICD-10-CM

## 2020-08-15 DIAGNOSIS — Z1211 Encounter for screening for malignant neoplasm of colon: Secondary | ICD-10-CM

## 2020-08-15 DIAGNOSIS — K219 Gastro-esophageal reflux disease without esophagitis: Secondary | ICD-10-CM | POA: Diagnosis not present

## 2020-08-15 DIAGNOSIS — E039 Hypothyroidism, unspecified: Secondary | ICD-10-CM | POA: Diagnosis not present

## 2020-08-15 DIAGNOSIS — E2839 Other primary ovarian failure: Secondary | ICD-10-CM

## 2020-08-15 DIAGNOSIS — R5383 Other fatigue: Secondary | ICD-10-CM

## 2020-08-15 NOTE — Progress Notes (Signed)
[facility] [place], [place] [address]  Internal MEDICINE  Office Visit Note  Patient Name: Regina Irwin  [id number]  [id number]  Date of Service: 08/15/2020  Chief Complaint  Patient presents with  . Follow-up  . Gastroesophageal Reflux  . Quality Metric Gaps    Mammogram, colonoscopy, dexa    HPI  Pt is here for routine f/u and has no complaints.  -She is still taking levothyroxine. -GERD: Takes Ca-phos gummies and this seems to help. She does not eat dinner, this seems to help her stomach. Eats moderately for breakfast and lunch. She is vegan. Likes to eat very healthy. Discussed concern for her getting enough nutrition and keeping her wt up. Wt has actually gone up a little since last visit. Pt watches this closely. -Pt does not want to go for colonoscopy, but is willing to do Cologuard. -Will reorder mammogrram and BMD since pt did not get these done due to covid.  Current Medication: Outpatient Encounter Medications as of 08/15/2020  Medication Sig  . Ca Phosphate-Cholecalciferol 200-200 MG-UNIT CHEW Chew by mouth.  . Cholecalciferol (VITAMIN D3) 1000 units CAPS Take by mouth.  . levothyroxine (SYNTHROID) 75 MCG tablet Take 1 tablet (75 mcg total) by mouth daily before breakfast.  . Multiple Vitamin (MULTI-VITAMINS) TABS Take by mouth.  . [DISCONTINUED] ciprofloxacin (CIPRO) 500 MG tablet Take 1 tablet (500 mg total) by mouth 2 (two) times daily. (Patient not taking: Reported on 08/15/2020)  . [DISCONTINUED] clarithromycin (BIAXIN) 500 MG tablet Take 1 tablet (500 mg total) by mouth 2 (two) times daily. (Patient not taking: Reported on 02/12/2020)   No facility-administered encounter medications on file as of 08/15/2020.    Surgical History: Past Surgical History:  Procedure Laterality Date  . tumor removed  right foot      Medical History: Past Medical History:  Diagnosis Date  . GERD (gastroesophageal reflux disease)   . Thyroid disease      Family History: Family History  Problem Relation Age of Onset  . Diabetes Father   . Lung cancer Father   . Drug abuse Paternal Grandmother   . Drug abuse Paternal Grandfather   . Gallstones Mother   . Melanoma Mother   . Melanoma Brother   . Cancer Maternal Grandmother     Social History   Socioeconomic History  . Marital status: Single    Spouse name: Not on file  . Number of children: Not on file  . Years of education: Not on file  . Highest education level: Not on file  Occupational History  . Not on file  Tobacco Use  . Smoking status: Never Smoker  . Smokeless tobacco: Never Used  Vaping Use  . Vaping Use: Never used  Substance and Sexual Activity  . Alcohol use: Never  . Drug use: Never  . Sexual activity: Not on file  Other Topics Concern  . Not on file  Social History Narrative  . Not on file   Social Determinants of Health   Financial Resource Strain: Not on file  Food Insecurity: Not on file  Transportation Needs: Not on file  Physical Activity: Not on file  Stress: Not on file  Social Connections: Not on file  Intimate Partner Violence: Not on file      Review of Systems  Constitutional: Negative for appetite change, chills, fatigue and unexpected weight change.  HENT: Negative for congestion, postnasal drip, rhinorrhea, sneezing and sore throat.   Eyes: Negative for redness.  Respiratory: Negative for cough, chest tightness and shortness of breath.   Cardiovascular: Negative for chest pain and palpitations.  Gastrointestinal: Negative for abdominal pain, constipation, diarrhea, nausea and vomiting.  Genitourinary: Negative for dysuria and frequency.  Musculoskeletal: Negative for arthralgias, back pain, joint swelling and neck pain.  Skin: Negative for rash.  Neurological: Negative.  Negative for tremors and numbness.  Hematological: Negative for adenopathy. Does not bruise/bleed easily.  Psychiatric/Behavioral: Negative for behavioral  problems (Depression), sleep disturbance and suicidal ideas. The patient is not nervous/anxious.     Vital Signs: BP 116/74   Pulse 62   Temp (!) 97.4 F (36.3 C)   Resp 16   Ht 5\' 4"  (1.626 m)   Wt 110 lb 3.2 oz (50 kg)   SpO2 99%   BMI 18.92 kg/m    Physical Exam Constitutional:      General: She is not in acute distress.    Appearance: She is well-developed. She is not diaphoretic.  HENT:     Head: Normocephalic and atraumatic.     Mouth/Throat:     Pharynx: No oropharyngeal exudate.  Eyes:     Pupils: Pupils are equal, round, and reactive to light.  Neck:     Thyroid: No thyromegaly.     Vascular: No JVD.     Trachea: No tracheal deviation.  Cardiovascular:     Rate and Rhythm: Normal rate and regular rhythm.     Heart sounds: Normal heart sounds. No murmur heard. No friction rub. No gallop.   Pulmonary:     Effort: Pulmonary effort is normal. No respiratory distress.     Breath sounds: No wheezing or rales.  Chest:     Chest wall: No tenderness.  Abdominal:     General: Bowel sounds are normal.     Palpations: Abdomen is soft.  Musculoskeletal:        General: Normal range of motion.     Cervical back: Normal range of motion and neck supple.  Lymphadenopathy:     Cervical: No cervical adenopathy.  Skin:    General: Skin is warm and dry.  Neurological:     Mental Status: She is alert and oriented to person, place, and time.     Cranial Nerves: No cranial nerve deficit.  Psychiatric:        Behavior: Behavior normal.        Thought Content: Thought content normal.        Judgment: Judgment normal.        Assessment/Plan: 1. Acquired hypothyroidism Continue Synthroid as prescribed. Will recheck labs before physical. - TSH; Future - T4, free; Future  2. Gastroesophageal reflux disease without esophagitis Well controlled at this time. She is not taking a PPI anymore. Uses Ca-Phos gummies which seem to help her and she does not eat anything in he  evening.  3. Encounter for screening mammogram for malignant neoplasm of breast - MM DIGITAL SCREENING BILATERAL; Future  4. Other primary ovarian failure - DG Bone Density; Future  5. Screening for malignant neoplasm of colon Will be set up on Cologuard  6. Other fatigue - CBC with Differential/Platelet; Future - Lipid Panel With LDL/HDL Ratio; Future - TSH; Future - T4, free; Future - Comprehensive metabolic panel - Z61 and Folate Panel - VITAMIN D 25 Hydroxy (Vit-D Deficiency, Fractures); Future   General Counseling: andree heeg understanding of the findings of todays visit and agrees with plan of treatment. I have discussed any further diagnostic evaluation that may  be needed or ordered today. We also reviewed her medications today. she has been encouraged to call the office with any questions or concerns that should arise related to todays visit.    Orders Placed This Encounter  Procedures  . DG Bone Density  . MM DIGITAL SCREENING BILATERAL  . CBC with Differential/Platelet  . Lipid Panel With LDL/HDL Ratio  . TSH  . T4, free  . Comprehensive metabolic panel  . B12 and Folate Panel  . VITAMIN D 25 Hydroxy (Vit-D Deficiency, Fractures)    No orders of the defined types were placed in this encounter.   This patient was seen by Drema Dallas, PA-C in collaboration with Dr. Clayborn Bigness as a part of collaborative care agreement.   Total time spent: 30 Minutes Time spent includes review of chart, medications, test results, and follow up plan with the patient.      Dr Lavera Guise Internal medicine

## 2020-08-19 ENCOUNTER — Telehealth: Payer: Self-pay

## 2020-08-19 NOTE — Telephone Encounter (Signed)
Faxed cologuard 

## 2020-08-19 NOTE — Telephone Encounter (Signed)
Faxed cologuard order form 

## 2020-08-22 DIAGNOSIS — Z1211 Encounter for screening for malignant neoplasm of colon: Secondary | ICD-10-CM | POA: Diagnosis not present

## 2020-08-22 DIAGNOSIS — Z1212 Encounter for screening for malignant neoplasm of rectum: Secondary | ICD-10-CM | POA: Diagnosis not present

## 2020-08-30 ENCOUNTER — Telehealth: Payer: Self-pay

## 2020-08-30 NOTE — Telephone Encounter (Signed)
Lmom to pt to cal [place] back that Cologuard is negative

## 2020-09-02 ENCOUNTER — Telehealth: Payer: Self-pay

## 2020-09-02 NOTE — Telephone Encounter (Signed)
Spoke to pt and informed her cologuard was negative

## 2020-09-04 ENCOUNTER — Other Ambulatory Visit: Payer: Self-pay | Admitting: Nurse Practitioner

## 2020-09-04 DIAGNOSIS — E039 Hypothyroidism, unspecified: Secondary | ICD-10-CM

## 2020-09-05 ENCOUNTER — Other Ambulatory Visit: Payer: Self-pay

## 2020-09-05 DIAGNOSIS — E039 Hypothyroidism, unspecified: Secondary | ICD-10-CM

## 2020-09-05 MED ORDER — LEVOTHYROXINE SODIUM 75 MCG PO TABS: 75.0000 ug | ORAL_TABLET | Freq: Every day | ORAL | 5 refills | Status: DC

## 2020-09-23 LAB — COLOGUARD

## 2020-12-18 ENCOUNTER — Telehealth: Payer: Self-pay

## 2020-12-18 NOTE — Telephone Encounter (Signed)
Jade care coordinator with [facility] called and asked for an appt for the pt. Patient is needing a mammogram ordered. Contacted patient to try to schedule and appt. No answer had to leave a VM. Unable to schedule appt.

## 2020-12-24 ENCOUNTER — Other Ambulatory Visit: Payer: Self-pay

## 2020-12-24 ENCOUNTER — Encounter: Payer: Self-pay | Admitting: Nurse Practitioner

## 2020-12-24 ENCOUNTER — Ambulatory Visit: Payer: BC Managed Care – PPO | Admitting: Nurse Practitioner

## 2020-12-24 VITALS — BP 102/61 | HR 58 | Temp 98.4°F | Resp 16 | Ht 64.0 in | Wt 110.2 lb

## 2020-12-24 DIAGNOSIS — M151 Heberden's nodes (with arthropathy): Secondary | ICD-10-CM

## 2020-12-24 DIAGNOSIS — Z13820 Encounter for screening for osteoporosis: Secondary | ICD-10-CM | POA: Diagnosis not present

## 2020-12-24 DIAGNOSIS — K529 Noninfective gastroenteritis and colitis, unspecified: Secondary | ICD-10-CM | POA: Diagnosis not present

## 2020-12-24 DIAGNOSIS — Z1231 Encounter for screening mammogram for malignant neoplasm of breast: Secondary | ICD-10-CM | POA: Diagnosis not present

## 2020-12-24 MED ORDER — LEVOFLOXACIN 500 MG PO TABS: 500.0000 mg | ORAL_TABLET | Freq: Every day | ORAL | 0 refills | Status: DC

## 2020-12-24 NOTE — Progress Notes (Signed)
[facility] [place], [place] [address]  Internal MEDICINE  Office Visit Note  Patient Name: Regina Irwin  [id number]  [id number]  Date of Service: 12/28/2020  Chief Complaint  Patient presents with   Follow-up    Needs order for mammogram, Stomach pain due to [place] food, asking for antibiotic to treat the symptoms        HPI Regina Irwin presents for a follow up visit to discuss mammogram order and colitis related to consumption of [place] food. Regina Irwin needs an order for a screening mammogram.She reports she ate some tofu that went bad and has been having loose Bms and some abdominal cramping off and on for the past 2 weeks. She states that this has happened before and is usually cleared up with a course of antibiotics. According to her chart, this has occurred at least 2 other times, both during 2021.  She also has heberden's nodes on several finger joints in her hands bilaterally.     Current Medication: Outpatient Encounter Medications as of 12/24/2020  Medication Sig   Ca Phosphate-Cholecalciferol 200-200 MG-UNIT CHEW Chew by mouth.   Cholecalciferol (VITAMIN D3) 1000 units CAPS Take by mouth.   levofloxacin (LEVAQUIN) 500 MG tablet Take 1 tablet (500 mg total) by mouth daily.   levothyroxine (SYNTHROID) 75 MCG tablet Take 1 tablet (75 mcg total) by mouth daily before breakfast.   Multiple Vitamin (MULTI-VITAMINS) TABS Take by mouth.   No facility-administered encounter medications on file as of 12/24/2020.    Surgical History: Past Surgical History:  Procedure Laterality Date   tumor removed  right foot      Medical History: Past Medical History:  Diagnosis Date   GERD (gastroesophageal reflux disease)    Thyroid disease     Family History: Family History  Problem Relation Age of Onset   Diabetes Father    Lung cancer Father    Drug abuse Paternal Grandmother    Drug abuse Paternal Grandfather    Gallstones Mother    Melanoma Mother     Melanoma Brother    Cancer Maternal Grandmother     Social History   Socioeconomic History   Marital status: Single    Spouse name: Not on file   Number of children: Not on file   Years of education: Not on file   Highest education level: Not on file  Occupational History   Not on file  Tobacco Use   Smoking status: Never   Smokeless tobacco: Never  Vaping Use   Vaping Use: Never used  Substance and Sexual Activity   Alcohol use: Never   Drug use: Never   Sexual activity: Not on file  Other Topics Concern   Not on file  Social History Narrative   Not on file   Social Determinants of Health   Financial Resource Strain: Not on file  Food Insecurity: Not on file  Transportation Needs: Not on file  Physical Activity: Not on file  Stress: Not on file  Social Connections: Not on file  Intimate Partner Violence: Not on file      Review of Systems  Constitutional:  Negative for chills, fatigue and unexpected weight change.  HENT:  Negative for congestion, rhinorrhea, sneezing and sore throat.   Eyes:  Negative for redness.  Respiratory:  Negative for cough, chest tightness and shortness of breath.   Cardiovascular:  Negative for chest pain and palpitations.  Gastrointestinal:  Negative for abdominal pain, constipation, diarrhea, nausea and vomiting.  Genitourinary:  Negative for dysuria and frequency.  Musculoskeletal:  Positive for arthralgias and joint swelling. Negative for back pain and neck pain.  Skin:  Negative for rash.  Neurological: Negative.  Negative for tremors and numbness.  Hematological:  Negative for adenopathy. Does not bruise/bleed easily.  Psychiatric/Behavioral:  Negative for behavioral problems (Depression), sleep disturbance and suicidal ideas. The patient is not nervous/anxious.    Vital Signs: BP 102/61   Pulse (!) 58   Temp 98.4 F (36.9 C)   Resp 16   Ht 5\' 4"  (1.626 m)   Wt 110 lb 3.2 oz (50 kg)   SpO2 97%   BMI 18.92 kg/m     Physical Exam Vitals reviewed.  Constitutional:      General: She is not in acute distress.    Appearance: Normal appearance. She is normal weight. She is not ill-appearing.  HENT:     Head: Normocephalic and atraumatic.  Cardiovascular:     Rate and Rhythm: Normal rate and regular rhythm.     Heart sounds: Normal heart sounds.  Pulmonary:     Effort: Pulmonary effort is normal. No respiratory distress.  Musculoskeletal:     Right hand: Deformity present.     Left hand: Deformity present.     Comments: Heberden's nodes noted on several finger joints bilaterally.  Skin:    General: Skin is warm and dry.     Capillary Refill: Capillary refill takes less than 2 seconds.  Neurological:     Mental Status: She is alert and oriented to person, place, and time.  Psychiatric:        Mood and Affect: Mood normal.        Behavior: Behavior normal.    Assessment/Plan: 1. Colitis 5 day course of levofloxacin sent to pharmacy.  - levofloxacin (LEVAQUIN) 500 MG tablet; Take 1 tablet (500 mg total) by mouth daily.  Dispense: 5 tablet; Refill: 0  2. Heberden's nodes of both hands Patient asked about nodules on some of her finger joints. 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   3. Encounter for screening mammogram for malignant neoplasm of breast Order placed for 3D mammogram. - MM 3D SCREEN BREAST BILATERAL; Future  4. Screening for osteoporosis Bone density scan ordered.  - DG Bone Density; Future   General Counseling: Regina Irwin understanding of the findings of todays visit and agrees with plan of treatment. I have discussed any further diagnostic evaluation that may be needed or ordered today. We also reviewed her medications today. she has been encouraged to call the office with any questions or concerns that should arise related to todays visit.    Orders Placed This Encounter  Procedures   DG Bone Density   MM 3D SCREEN  BREAST BILATERAL    Meds ordered this encounter  Medications   levofloxacin (LEVAQUIN) 500 MG tablet    Sig: Take 1 tablet (500 mg total) by mouth daily.    Dispense:  5 tablet    Refill:  0    Return in 7 weeks (on 02/13/2021) for F/U previously scheduled appt wiht Lauren PA-C.   Total time spent:30 Minutes Time spent includes review of chart, medications, test results, and follow up plan with the patient.   [place] Controlled Substance Database was reviewed by me.  This patient was seen by Jonetta Osgood, FNP-C in collaboration with Dr. Clayborn Bigness as a part of collaborative care agreement.   Krissy Orebaugh R. Valetta Fuller, MSN, FNP-C Internal  medicine

## 2021-01-30 ENCOUNTER — Ambulatory Visit
Admission: RE | Admit: 2021-01-30 | Discharge: 2021-01-30 | Disposition: A | Discharge status: Home / Self Care | Payer: BC Managed Care – PPO | Source: Ambulatory Visit | Attending: Nurse Practitioner | Admitting: Nurse Practitioner

## 2021-01-30 ENCOUNTER — Other Ambulatory Visit: Payer: Self-pay

## 2021-01-30 DIAGNOSIS — Z1231 Encounter for screening mammogram for malignant neoplasm of breast: Secondary | ICD-10-CM | POA: Insufficient documentation

## 2021-01-30 DIAGNOSIS — Z13820 Encounter for screening for osteoporosis: Secondary | ICD-10-CM | POA: Diagnosis not present

## 2021-01-30 DIAGNOSIS — M8588 Other specified disorders of bone density and structure, other site: Secondary | ICD-10-CM | POA: Diagnosis not present

## 2021-01-30 DIAGNOSIS — M810 Age-related osteoporosis without current pathological fracture: Secondary | ICD-10-CM | POA: Diagnosis not present

## 2021-01-30 DIAGNOSIS — Z780 Asymptomatic menopausal state: Secondary | ICD-10-CM | POA: Diagnosis not present

## 2021-02-12 ENCOUNTER — Telehealth: Payer: Self-pay

## 2021-02-12 NOTE — Telephone Encounter (Signed)
Left vm to confirm 02/13/21 appointment-Toni

## 2021-02-13 ENCOUNTER — Other Ambulatory Visit: Payer: Self-pay

## 2021-02-13 ENCOUNTER — Encounter (INDEPENDENT_AMBULATORY_CARE_PROVIDER_SITE_OTHER): Payer: Self-pay

## 2021-02-13 ENCOUNTER — Ambulatory Visit (INDEPENDENT_AMBULATORY_CARE_PROVIDER_SITE_OTHER): Payer: BC Managed Care – PPO | Admitting: Physician Assistant

## 2021-02-13 ENCOUNTER — Encounter: Payer: Self-pay | Admitting: Physician Assistant

## 2021-02-13 DIAGNOSIS — M810 Age-related osteoporosis without current pathological fracture: Secondary | ICD-10-CM | POA: Diagnosis not present

## 2021-02-13 DIAGNOSIS — E559 Vitamin D deficiency, unspecified: Secondary | ICD-10-CM

## 2021-02-13 DIAGNOSIS — R636 Underweight: Secondary | ICD-10-CM | POA: Diagnosis not present

## 2021-02-13 DIAGNOSIS — E039 Hypothyroidism, unspecified: Secondary | ICD-10-CM

## 2021-02-13 DIAGNOSIS — Z0001 Encounter for general adult medical examination with abnormal findings: Secondary | ICD-10-CM | POA: Diagnosis not present

## 2021-02-13 DIAGNOSIS — E538 Deficiency of other specified B group vitamins: Secondary | ICD-10-CM

## 2021-02-13 DIAGNOSIS — E782 Mixed hyperlipidemia: Secondary | ICD-10-CM

## 2021-02-13 DIAGNOSIS — R5383 Other fatigue: Secondary | ICD-10-CM

## 2021-02-13 MED ORDER — TETANUS-DIPHTH-ACELL PERTUSSIS 5-2.5-18.5 LF-MCG/0.5 IM SUSP: 0.5000 mL | Freq: Once | INTRAMUSCULAR | 0 refills | Status: AC

## 2021-02-13 MED ORDER — ALENDRONATE SODIUM 70 MG PO TABS: 70.0000 mg | ORAL_TABLET | ORAL | 11 refills | Status: DC

## 2021-02-13 NOTE — Progress Notes (Signed)
[facility] [place], [place] [address]  Internal MEDICINE  Office Visit Note  Patient Name: Regina Irwin  [id number]  [id number]  Date of Service: 02/15/2021  Chief Complaint  Patient presents with   Annual Exam   Gastroesophageal Reflux     HPI Pt is here for routine health maintenance examination -Fhx of stomach cancer but no personal hx; has seen GI doctor in past.  -Cologuard done in March -Discussed needing to increase protein and gain weight. Recommended protein meal shake supplements. Patient is vegan and states she will look into increasing intake. -Discussed results of bone density show that she does have osteoporosis and will start on treatment. Discussed how to take the medication and that there are alternatives if not tolerated -Due for fasting labs -UTD on mammogram and cologuard  Current Medication: Outpatient Encounter Medications as of 02/13/2021  Medication Sig   alendronate (FOSAMAX) 70 MG tablet Take 1 tablet (70 mg total) by mouth every 7 (seven) days. Take with a full glass of water on an empty stomach.   Ca Phosphate-Cholecalciferol 200-200 MG-UNIT CHEW Chew by mouth.   Cholecalciferol (VITAMIN D3) 1000 units CAPS Take by mouth.   levothyroxine (SYNTHROID) 75 MCG tablet Take 1 tablet (75 mcg total) by mouth daily before breakfast.   Multiple Vitamin (MULTI-VITAMINS) TABS Take by mouth.   [DISCONTINUED] levofloxacin (LEVAQUIN) 500 MG tablet Take 1 tablet (500 mg total) by mouth daily.   [DISCONTINUED] Tdap (BOOSTRIX) 5-2.5-18.5 LF-MCG/0.5 injection Inject 0.5 mLs into the muscle once.   [EXPIRED] Tdap (BOOSTRIX) 5-2.5-18.5 LF-MCG/0.5 injection Inject 0.5 mLs into the muscle once for 1 dose.   No facility-administered encounter medications on file as of 02/13/2021.    Surgical History: Past Surgical History:  Procedure Laterality Date   tumor removed  right foot      Medical History: Past Medical History:  Diagnosis Date    GERD (gastroesophageal reflux disease)    Thyroid disease     Family History: Family History  Problem Relation Age of Onset   Diabetes Father    Lung cancer Father    Drug abuse Paternal Grandmother    Drug abuse Paternal Grandfather    Gallstones Mother    Melanoma Mother    Melanoma Brother    Cancer Maternal Grandmother       Review of Systems  Constitutional:  Negative for chills, fatigue and unexpected weight change.  HENT:  Negative for congestion, postnasal drip, rhinorrhea, sneezing and sore throat.   Eyes:  Negative for redness.  Respiratory:  Negative for cough, chest tightness, shortness of breath and wheezing.   Cardiovascular:  Negative for chest pain and palpitations.  Gastrointestinal:  Negative for abdominal pain, constipation, diarrhea, nausea and vomiting.  Genitourinary:  Negative for dysuria and frequency.  Musculoskeletal:  Negative for arthralgias, back pain, joint swelling and neck pain.  Skin:  Negative for rash.  Neurological: Negative.  Negative for tremors and numbness.  Hematological:  Negative for adenopathy. Does not bruise/bleed easily.  Psychiatric/Behavioral:  Negative for behavioral problems (Depression), sleep disturbance and suicidal ideas. The patient is nervous/anxious.     Vital Signs: BP 110/70   Pulse (!) 56   Temp 97.8 F (36.6 C)   Resp 16   Ht '5\' 4"'$  (1.626 m)   Wt 107 lb 6.4 oz (48.7 kg)   SpO2 98%   BMI 18.44 kg/m    Physical Exam Vitals and nursing note reviewed.  Constitutional:      General:  She is not in acute distress.    Appearance: She is well-developed. She is not diaphoretic.     Comments: Underweight  HENT:     Head: Normocephalic and atraumatic.     Right Ear: External ear normal.     Left Ear: External ear normal.     Nose: Nose normal.     Mouth/Throat:     Pharynx: No oropharyngeal exudate.  Eyes:     General: No scleral icterus.       Right eye: No discharge.        Left eye: No discharge.      Conjunctiva/sclera: Conjunctivae normal.     Pupils: Pupils are equal, round, and reactive to light.  Neck:     Thyroid: No thyromegaly.     Vascular: No JVD.     Trachea: No tracheal deviation.  Cardiovascular:     Rate and Rhythm: Normal rate and regular rhythm.     Heart sounds: Normal heart sounds. No murmur heard.   No friction rub. No gallop.  Pulmonary:     Effort: Pulmonary effort is normal. No respiratory distress.     Breath sounds: Normal breath sounds. No stridor. No wheezing or rales.  Chest:     Chest wall: No tenderness.  Abdominal:     General: Bowel sounds are normal. There is no distension.     Palpations: Abdomen is soft. There is no mass.     Tenderness: There is no abdominal tenderness. There is no guarding or rebound.  Musculoskeletal:        General: No tenderness or deformity. Normal range of motion.     Cervical back: Normal range of motion and neck supple.  Lymphadenopathy:     Cervical: No cervical adenopathy.  Skin:    General: Skin is warm and dry.     Coloration: Skin is not pale.     Findings: No erythema or rash.  Neurological:     Mental Status: She is alert.     Cranial Nerves: No cranial nerve deficit.     Motor: No abnormal muscle tone.     Coordination: Coordination normal.     Deep Tendon Reflexes: Reflexes are normal and symmetric.  Psychiatric:        Behavior: Behavior normal.        Thought Content: Thought content normal.        Judgment: Judgment normal.     LABS: No results found for this or any previous visit (from the past 2160 hour(s)).      Assessment/Plan: 1. Encounter for general adult medical examination with abnormal findings CPE performed, will update labs, UTD on PHM   2. Acquired hypothyroidism Will update labs and adjust dose as indicated - TSH + free T4  3. Age-related osteoporosis without current pathological fracture Will start on Fosamax weekly - alendronate (FOSAMAX) 70 MG tablet; Take 1 tablet  (70 mg total) by mouth every 7 (seven) days. Take with a full glass of water on an empty stomach.  Dispense: 4 tablet; Refill: 11  4. Underweight Discussed increasing caloric intake, especially protein. Recommended protien shake supplement  5. Vitamin D deficiency - VITAMIN D 25 Hydroxy (Vit-D Deficiency, Fractures)  6. Mixed hyperlipidemia - Lipid Panel With LDL/HDL Ratio  7. B12 deficiency - B12 and Folate Panel  8. Other fatigue - CBC w/Diff/Platelet - Comprehensive metabolic panel - Hgb 123XX123 w/o eAG   General Counseling: Sabryna verbalizes understanding of the findings of todays visit and  agrees with plan of treatment. I have discussed any further diagnostic evaluation that may be needed or ordered today. We also reviewed her medications today. she has been encouraged to call the office with any questions or concerns that should arise related to todays visit.    Counseling:    Orders Placed This Encounter  Procedures   CBC w/Diff/Platelet   Comprehensive metabolic panel   Lipid Panel With LDL/HDL Ratio   Hgb A1C w/o eAG   TSH + free T4   B12 and Folate Panel   VITAMIN D 25 Hydroxy (Vit-D Deficiency, Fractures)     Meds ordered this encounter  Medications   Tdap (BOOSTRIX) 5-2.5-18.5 LF-MCG/0.5 injection    Sig: Inject 0.5 mLs into the muscle once for 1 dose.    Dispense:  0.5 mL    Refill:  0   alendronate (FOSAMAX) 70 MG tablet    Sig: Take 1 tablet (70 mg total) by mouth every 7 (seven) days. Take with a full glass of water on an empty stomach.    Dispense:  4 tablet    Refill:  11     This patient was seen by Drema Dallas, PA-C in collaboration with Dr. Clayborn Bigness as a part of collaborative care agreement.  Total time spent:35 Minutes  Time spent includes review of chart, medications, test results, and follow up plan with the patient.     Lavera Guise, MD  Internal Medicine

## 2021-03-08 ENCOUNTER — Other Ambulatory Visit: Payer: Self-pay | Admitting: Physician Assistant

## 2021-03-08 DIAGNOSIS — E039 Hypothyroidism, unspecified: Secondary | ICD-10-CM

## 2021-03-27 DIAGNOSIS — H40023 Open angle with borderline findings, high risk, bilateral: Secondary | ICD-10-CM | POA: Diagnosis not present

## 2021-03-27 DIAGNOSIS — H02885 Meibomian gland dysfunction left lower eyelid: Secondary | ICD-10-CM | POA: Diagnosis not present

## 2021-03-27 DIAGNOSIS — H2513 Age-related nuclear cataract, bilateral: Secondary | ICD-10-CM | POA: Diagnosis not present

## 2021-04-01 DIAGNOSIS — R5383 Other fatigue: Secondary | ICD-10-CM | POA: Diagnosis not present

## 2021-04-01 DIAGNOSIS — E538 Deficiency of other specified B group vitamins: Secondary | ICD-10-CM | POA: Diagnosis not present

## 2021-04-01 DIAGNOSIS — E559 Vitamin D deficiency, unspecified: Secondary | ICD-10-CM | POA: Diagnosis not present

## 2021-04-01 DIAGNOSIS — E782 Mixed hyperlipidemia: Secondary | ICD-10-CM | POA: Diagnosis not present

## 2021-04-01 DIAGNOSIS — E039 Hypothyroidism, unspecified: Secondary | ICD-10-CM | POA: Diagnosis not present

## 2021-04-02 LAB — LIPID PANEL WITH LDL/HDL RATIO
Cholesterol, Total: 144 mg/dL (ref 100–199)
HDL: 63 mg/dL (ref >39)
LDL Chol Calc (NIH): 67 mg/dL (ref 0–99)
LDL/HDL Ratio: 1.1 ratio (ref 0.0–3.2)
Triglycerides: 70 mg/dL (ref 0–149)
VLDL Cholesterol Cal: 14 mg/dL (ref 5–40)

## 2021-04-02 LAB — COMPREHENSIVE METABOLIC PANEL
ALT: 19 IU/L (ref 0–32)
AST: 21 IU/L (ref 0–40)
Albumin/Globulin Ratio: 2.2 (ref 1.2–2.2)
Albumin: 4.4 g/dL (ref 3.8–4.8)
Alkaline Phosphatase: 104 IU/L (ref 44–121)
BUN/Creatinine Ratio: 34 — ABNORMAL HIGH (ref 12–28)
BUN: 27 mg/dL (ref 8–27)
Bilirubin Total: 0.7 mg/dL (ref 0.0–1.2)
CO2: 25 mmol/L (ref 20–29)
Calcium: 9.3 mg/dL (ref 8.7–10.3)
Chloride: 104 mmol/L (ref 96–106)
Creatinine, Ser: 0.8 mg/dL (ref 0.57–1.00)
Globulin, Total: 2 g/dL (ref 1.5–4.5)
Glucose: 88 mg/dL (ref 70–99)
Potassium: 4.5 mmol/L (ref 3.5–5.2)
Sodium: 143 mmol/L (ref 134–144)
Total Protein: 6.4 g/dL (ref 6.0–8.5)
eGFR: 81 mL/min/{1.73_m2} (ref >59)

## 2021-04-02 LAB — B12 AND FOLATE PANEL
Folate: >20 ng/mL (ref >3.0)
Vitamin B-12: 873 pg/mL (ref 232–1245)

## 2021-04-02 LAB — CBC WITH DIFFERENTIAL/PLATELET
Basophils Absolute: 0 10*3/uL (ref 0.0–0.2)
Basos: 1 %
EOS (ABSOLUTE): 0.1 10*3/uL (ref 0.0–0.4)
Eos: 1 %
Hematocrit: 42 % (ref 34.0–46.6)
Hemoglobin: 13.9 g/dL (ref 11.1–15.9)
Immature Grans (Abs): 0 10*3/uL (ref 0.0–0.1)
Immature Granulocytes: 0 %
Lymphocytes Absolute: 1.1 10*3/uL (ref 0.7–3.1)
Lymphs: 32 %
MCH: 30 pg (ref 26.6–33.0)
MCHC: 33.1 g/dL (ref 31.5–35.7)
MCV: 91 fL (ref 79–97)
Monocytes Absolute: 0.4 10*3/uL (ref 0.1–0.9)
Monocytes: 11 %
Neutrophils Absolute: 1.9 10*3/uL (ref 1.4–7.0)
Neutrophils: 55 %
Platelets: 205 10*3/uL (ref 150–450)
RBC: 4.63 x10E6/uL (ref 3.77–5.28)
RDW: 12.3 % (ref 11.7–15.4)
WBC: 3.5 10*3/uL (ref 3.4–10.8)

## 2021-04-02 LAB — TSH+FREE T4
Free T4: 1.38 ng/dL (ref 0.82–1.77)
TSH: 0.167 u[IU]/mL — ABNORMAL LOW (ref 0.450–4.500)

## 2021-04-02 LAB — VITAMIN D 25 HYDROXY (VIT D DEFICIENCY, FRACTURES): Vit D, 25-Hydroxy: 58.8 ng/mL (ref 30.0–100.0)

## 2021-04-02 LAB — HGB A1C W/O EAG: Hgb A1c MFr Bld: 5.3 % (ref 4.8–5.6)

## 2021-04-07 ENCOUNTER — Telehealth: Payer: Self-pay

## 2021-04-07 NOTE — Telephone Encounter (Signed)
Per patient's request, I mailed her most recent lab results-Toni

## 2021-06-05 ENCOUNTER — Encounter: Payer: Self-pay | Admitting: Physician Assistant

## 2021-06-05 ENCOUNTER — Other Ambulatory Visit: Payer: Self-pay

## 2021-06-05 ENCOUNTER — Ambulatory Visit: Payer: BC Managed Care – PPO | Admitting: Physician Assistant

## 2021-06-05 VITALS — BP 112/61 | HR 54 | Temp 98.6°F | Resp 16 | Ht 64.0 in | Wt 108.6 lb

## 2021-06-05 DIAGNOSIS — E039 Hypothyroidism, unspecified: Secondary | ICD-10-CM | POA: Diagnosis not present

## 2021-06-05 DIAGNOSIS — R636 Underweight: Secondary | ICD-10-CM | POA: Diagnosis not present

## 2021-06-05 DIAGNOSIS — K219 Gastro-esophageal reflux disease without esophagitis: Secondary | ICD-10-CM | POA: Diagnosis not present

## 2021-06-05 DIAGNOSIS — M810 Age-related osteoporosis without current pathological fracture: Secondary | ICD-10-CM

## 2021-06-05 NOTE — Progress Notes (Signed)
[facility] [place], [place] [address]  Internal MEDICINE  Office Visit Note  Patient Name: Regina Irwin  [id number]  [id number]  Date of Service: 06/16/2021  Chief Complaint  Patient presents with   Hypothyroidism    4 month f/u    HPI  Pt is here for routine follow up -Cannot take most protein shakes because she is vegan. -Pain in back and mid abdomen when she is hunched over. Thinks it may be gas. -no constipation or diarrhea, but does report a lot of gas -Does admit to increasing lentil intake to increase protein which may contribute -No chest pain or SOB -Will try taking GasX, may need to follow up with GI if continued problems -Also discussed she may be refluxing and should try pepcid/PPI again, but does not like to take this and takes Ca-Phos chews instead -Labs reviewed and overall look good  Current Medication: Outpatient Encounter Medications as of 06/05/2021  Medication Sig   alendronate (FOSAMAX) 70 MG tablet Take 1 tablet (70 mg total) by mouth every 7 (seven) days. Take with a full glass of water on an empty stomach.   Ca Phosphate-Cholecalciferol 200-200 MG-UNIT CHEW Chew by mouth.   Cholecalciferol (VITAMIN D3) 1000 units CAPS Take by mouth.   levothyroxine (SYNTHROID) 75 MCG tablet TAKE 1 TABLET BY MOUTH DAILY BEFORE BREAKFAST.   Multiple Vitamin (MULTI-VITAMINS) TABS Take by mouth.   No facility-administered encounter medications on file as of 06/05/2021.    Surgical History: Past Surgical History:  Procedure Laterality Date   tumor removed  right foot      Medical History: Past Medical History:  Diagnosis Date   GERD (gastroesophageal reflux disease)    Thyroid disease     Family History: Family History  Problem Relation Age of Onset   Diabetes Father    Lung cancer Father    Drug abuse Paternal Grandmother    Drug abuse Paternal Grandfather    Gallstones Mother    Melanoma Mother    Melanoma Brother     Cancer Maternal Grandmother     Social History   Socioeconomic History   Marital status: Single    Spouse name: Not on file   Number of children: Not on file   Years of education: Not on file   Highest education level: Not on file  Occupational History   Not on file  Tobacco Use   Smoking status: Never   Smokeless tobacco: Never  Vaping Use   Vaping Use: Never used  Substance and Sexual Activity   Alcohol use: Never   Drug use: Never   Sexual activity: Not on file  Other Topics Concern   Not on file  Social History Narrative   Not on file   Social Determinants of Health   Financial Resource Strain: Not on file  Food Insecurity: Not on file  Transportation Needs: Not on file  Physical Activity: Not on file  Stress: Not on file  Social Connections: Not on file  Intimate Partner Violence: Not on file      Review of Systems  Constitutional:  Negative for appetite change, chills, fatigue and unexpected weight change.  HENT:  Negative for congestion, postnasal drip, rhinorrhea, sneezing and sore throat.   Eyes:  Negative for redness.  Respiratory:  Negative for cough, chest tightness and shortness of breath.   Cardiovascular:  Negative for chest pain and palpitations.  Gastrointestinal:  Negative for abdominal pain, constipation, diarrhea, nausea and vomiting.  Gas and reflux  Genitourinary:  Negative for dysuria and frequency.  Musculoskeletal:  Negative for arthralgias, back pain, joint swelling and neck pain.  Skin:  Negative for rash.  Neurological: Negative.  Negative for tremors and numbness.  Hematological:  Negative for adenopathy. Does not bruise/bleed easily.  Psychiatric/Behavioral:  Negative for behavioral problems (Depression), sleep disturbance and suicidal ideas. The patient is nervous/anxious.    Vital Signs: BP 112/61    Pulse (!) 54    Temp 98.6 F (37 C)    Resp 16    Ht 5\' 4"  (1.626 m)    Wt 108 lb 9.6 oz (49.3 kg)    SpO2 99%    BMI 18.64  kg/m    Physical Exam Vitals and nursing note reviewed.  Constitutional:      General: She is not in acute distress.    Appearance: She is well-developed. She is not diaphoretic.     Comments: Underweight  HENT:     Head: Normocephalic and atraumatic.     Right Ear: External ear normal.     Left Ear: External ear normal.     Nose: Nose normal.     Mouth/Throat:     Pharynx: No oropharyngeal exudate.  Eyes:     General: No scleral icterus.       Right eye: No discharge.        Left eye: No discharge.     Conjunctiva/sclera: Conjunctivae normal.     Pupils: Pupils are equal, round, and reactive to light.  Neck:     Thyroid: No thyromegaly.     Vascular: No JVD.     Trachea: No tracheal deviation.  Cardiovascular:     Rate and Rhythm: Normal rate and regular rhythm.     Heart sounds: Normal heart sounds. No murmur heard.   No friction rub. No gallop.  Pulmonary:     Effort: Pulmonary effort is normal. No respiratory distress.     Breath sounds: Normal breath sounds. No stridor. No wheezing or rales.  Chest:     Chest wall: No tenderness.  Abdominal:     General: Bowel sounds are normal. There is no distension.     Palpations: Abdomen is soft. There is no mass.     Tenderness: There is no abdominal tenderness. There is no guarding or rebound.  Musculoskeletal:        General: No tenderness or deformity. Normal range of motion.     Cervical back: Normal range of motion and neck supple.  Lymphadenopathy:     Cervical: No cervical adenopathy.  Skin:    General: Skin is warm and dry.     Coloration: Skin is not pale.     Findings: No erythema or rash.  Neurological:     Mental Status: She is alert.     Cranial Nerves: No cranial nerve deficit.     Motor: No abnormal muscle tone.     Coordination: Coordination normal.     Deep Tendon Reflexes: Reflexes are normal and symmetric.  Psychiatric:        Behavior: Behavior normal.        Thought Content: Thought content  normal.        Judgment: Judgment normal.       Assessment/Plan: 1. Acquired hypothyroidism Continue synthroid as beforer  2. Age-related osteoporosis without current pathological fracture Continue Fosamax  3. Underweight Stable, from last visit--continue to increase caloric intake, especially protein.  4. Gastroesophageal reflux disease without esophagitis Advised  to retry pepcid or PPI and may use GasX for increased gas causing some abdominal discomfort   General Counseling: Antanette verbalizes understanding of the findings of todays visit and agrees with plan of treatment. I have discussed any further diagnostic evaluation that may be needed or ordered today. We also reviewed her medications today. she has been encouraged to call the office with any questions or concerns that should arise related to todays visit.    No orders of the defined types were placed in this encounter.   No orders of the defined types were placed in this encounter.   This patient was seen by Drema Dallas, PA-C in collaboration with Dr. Clayborn Bigness as a part of collaborative care agreement.   Total time spent:30 Minutes Time spent includes review of chart, medications, test results, and follow up plan with the patient.      Dr Lavera Guise Internal medicine

## 2021-06-24 ENCOUNTER — Telehealth: Payer: Self-pay

## 2021-06-24 DIAGNOSIS — H2513 Age-related nuclear cataract, bilateral: Secondary | ICD-10-CM | POA: Diagnosis not present

## 2021-06-24 DIAGNOSIS — H40003 Preglaucoma, unspecified, bilateral: Secondary | ICD-10-CM | POA: Diagnosis not present

## 2021-06-24 DIAGNOSIS — H16142 Punctate keratitis, left eye: Secondary | ICD-10-CM | POA: Diagnosis not present

## 2021-06-24 NOTE — Telephone Encounter (Signed)
Pt called that she was having acid reflux advised her to take Pepcid as discuss at last visit and if she is not feeling better need appt

## 2021-06-26 ENCOUNTER — Ambulatory Visit: Payer: BC Managed Care – PPO | Admitting: Physician Assistant

## 2021-09-04 ENCOUNTER — Encounter: Payer: Self-pay | Admitting: Physician Assistant

## 2021-09-04 ENCOUNTER — Telehealth: Payer: Self-pay

## 2021-09-04 ENCOUNTER — Other Ambulatory Visit: Payer: Self-pay

## 2021-09-04 ENCOUNTER — Ambulatory Visit: Payer: BC Managed Care – PPO | Admitting: Physician Assistant

## 2021-09-04 VITALS — BP 111/59 | HR 56 | Temp 97.9°F | Resp 16 | Ht 64.0 in | Wt 104.4 lb

## 2021-09-04 DIAGNOSIS — K219 Gastro-esophageal reflux disease without esophagitis: Secondary | ICD-10-CM

## 2021-09-04 DIAGNOSIS — R079 Chest pain, unspecified: Secondary | ICD-10-CM

## 2021-09-04 DIAGNOSIS — R636 Underweight: Secondary | ICD-10-CM

## 2021-09-04 DIAGNOSIS — M810 Age-related osteoporosis without current pathological fracture: Secondary | ICD-10-CM

## 2021-09-04 MED ORDER — OMEPRAZOLE 40 MG PO CPDR: 40.0000 mg | DELAYED_RELEASE_CAPSULE | Freq: Every day | ORAL | 3 refills | Status: DC

## 2021-09-04 NOTE — Telephone Encounter (Signed)
Make her appt today due to pt having chest pain ?

## 2021-09-04 NOTE — Telephone Encounter (Signed)
Pt called that she was having acid reflux and also when she bend over having chest pain going on for few days gave her appt tomorrow and advised if symptoms getting worse go to ED  ?

## 2021-09-04 NOTE — Progress Notes (Signed)
?[facility] ?[address] ?[place], [place] [address] ? ?Internal MEDICINE  ?Office Visit Note ? ?Patient Name: Regina Irwin ? [id number]  ?[id number] ? ?Date of Service: 09/10/2021 ? ?Chief Complaint  ?Patient presents with  ? Chest Pain  ? Gastroesophageal Reflux  ?  Has been having acid reflux - taking Pepcid which has kind of helped, pain mostly occurs in chest when bending over  ? ? ? ?HPI ?Pt is here for a sick visit. ?-Has a long history of GERD and has not sen GI in a very long time. Discussed checking upper GI but then may need to consider GI follow up for possible endoscopy ?-Was not taking anything but ca phos chews ?-For the past 3 weeks has been worsening, and causing more burning up into chest causing chest discomfort ?-Did start pepcid once per day which has helped some ?-Pain worse with bending over and goes through toward back ?-She has been taking Fosamax which she was previously told can make reflux worse, but she does not think the Fosamax has been impacting reflux. Did mention alternative infusion therapy options with endocrinology if reflux continues ?-EKG will be ordered due to CP  ? ?Current Medication: ? ?Outpatient Encounter Medications as of 09/04/2021  ?Medication Sig  ? alendronate (FOSAMAX) 70 MG tablet Take 1 tablet (70 mg total) by mouth every 7 (seven) days. Take with a full glass of water on an empty stomach.  ? Ca Phosphate-Cholecalciferol 200-200 MG-UNIT CHEW Chew by mouth.  ? Cholecalciferol (VITAMIN D3) 1000 units CAPS Take by mouth.  ? Multiple Vitamin (MULTI-VITAMINS) TABS Take by mouth.  ? omeprazole (PRILOSEC) 40 MG capsule Take 1 capsule (40 mg total) by mouth daily.  ? [DISCONTINUED] levothyroxine (SYNTHROID) 75 MCG tablet TAKE 1 TABLET BY MOUTH DAILY BEFORE BREAKFAST.  ? ?No facility-administered encounter medications on file as of 09/04/2021.  ? ? ? ? ?Medical History: ?Past Medical History:  ?Diagnosis Date  ? GERD (gastroesophageal reflux disease)   ?  Thyroid disease   ? ? ? ?Vital Signs: ?BP (!) 111/59   Pulse (!) 56   Temp 97.9 ?F (36.6 ?C)   Resp 16   Ht '5\' 4"'$  (1.626 m)   Wt 104 lb 6.4 oz (47.4 kg)   SpO2 99%   BMI 17.92 kg/m?  ? ? ?Review of Systems  ?Constitutional:  Negative for appetite change, chills, fatigue and unexpected weight change.  ?HENT:  Negative for congestion, postnasal drip, rhinorrhea, sneezing and sore throat.   ?Eyes:  Negative for redness.  ?Respiratory:  Negative for cough, chest tightness and shortness of breath.   ?Cardiovascular:  Positive for chest pain. Negative for palpitations.  ?Gastrointestinal:  Negative for abdominal pain, constipation, diarrhea, nausea and vomiting.  ?     Gas and reflux  ?Genitourinary:  Negative for dysuria and frequency.  ?Musculoskeletal:  Negative for arthralgias, back pain, joint swelling and neck pain.  ?Skin:  Negative for rash.  ?Neurological: Negative.  Negative for tremors and numbness.  ?Hematological:  Negative for adenopathy. Does not bruise/bleed easily.  ?Psychiatric/Behavioral:  Negative for behavioral problems (Depression), sleep disturbance and suicidal ideas. The patient is nervous/anxious.   ? ?Physical Exam ?Vitals and nursing note reviewed.  ?Constitutional:   ?   General: She is not in acute distress. ?   Appearance: She is well-developed. She is not diaphoretic.  ?   Comments: Underweight  ?HENT:  ?   Head: Normocephalic and atraumatic.  ?   Right Ear: External  ear normal.  ?   Left Ear: External ear normal.  ?   Nose: Nose normal.  ?Eyes:  ?   Conjunctiva/sclera: Conjunctivae normal.  ?   Pupils: Pupils are equal, round, and reactive to light.  ?Neck:  ?   Thyroid: No thyromegaly.  ?   Vascular: No JVD.  ?   Trachea: No tracheal deviation.  ?Cardiovascular:  ?   Rate and Rhythm: Regular rhythm. Bradycardia present.  ?   Heart sounds: Normal heart sounds. No murmur heard. ?  No friction rub. No gallop.  ?Pulmonary:  ?   Effort: Pulmonary effort is normal. No respiratory  distress.  ?   Breath sounds: Normal breath sounds. No stridor. No wheezing or rales.  ?Chest:  ?   Chest wall: No tenderness.  ?Abdominal:  ?   General: Bowel sounds are normal.  ?   Palpations: Abdomen is soft.  ?Musculoskeletal:     ?   General: No tenderness or deformity. Normal range of motion.  ?   Cervical back: Normal range of motion and neck supple.  ?Lymphadenopathy:  ?   Cervical: No cervical adenopathy.  ?Skin: ?   General: Skin is warm and dry.  ?   Coloration: Skin is not pale.  ?   Findings: No erythema or rash.  ?Neurological:  ?   Mental Status: She is alert.  ?   Cranial Nerves: No cranial nerve deficit.  ?   Motor: No abnormal muscle tone.  ?   Coordination: Coordination normal.  ?   Deep Tendon Reflexes: Reflexes are normal and symmetric.  ?Psychiatric:     ?   Behavior: Behavior normal.     ?   Thought Content: Thought content normal.     ?   Judgment: Judgment normal.  ? ? ? ? ?Assessment/Plan: ?1. Chest pain, unspecified type ?- EKG 12-Lead showed sinus bradycardia, no ST changes noted which is overall reassuring and given symptoms, chest pain seems to be attributed to severe reflux however patient advised to go to ED if acute worsening or chest pain radiates.  Also denies symptoms of her bradycardia with no dizziness or lightheadedness ? ?2. Gastroesophageal reflux disease, unspecified whether esophagitis present ?We will go ahead and start on omeprazole and order upper GI for further investigation.  Patient may need to see GI for possible endoscopy pending results and response to treatment ?- DG UGI W SINGLE CM (SOL OR THIN BA); Future ?- omeprazole (PRILOSEC) 40 MG capsule; Take 1 capsule (40 mg total) by mouth daily.  Dispense: 30 capsule; Refill: 3 ? ?3. Age-related osteoporosis without current pathological fracture ?Discussed the possibility of needing to stop Fosamax and switch to infusion therapy with endocrinology.  Patient does not believe Fosamax is cause for worsening symptoms and  would like to stay on this for now ? ?4. Underweight ?Patient is down 4 pounds from last visit and some of this may be due to worsening reflux at this time, will need to increase protein and overall caloric intake. ? ? ?General Counseling: mammie meras understanding of the findings of todays visit and agrees with plan of treatment. I have discussed any further diagnostic evaluation that may be needed or ordered today. We also reviewed her medications today. she has been encouraged to call the office with any questions or concerns that should arise related to todays visit. ? ? ? ?Counseling: ? ? ? ?Orders Placed This Encounter  ?Procedures  ? DG UGI W SINGLE CM (SOL  OR THIN BA)  ? EKG 12-Lead  ? ? ?Meds ordered this encounter  ?Medications  ? omeprazole (PRILOSEC) 40 MG capsule  ?  Sig: Take 1 capsule (40 mg total) by mouth daily.  ?  Dispense:  30 capsule  ?  Refill:  3  ? ? ?Time spent:35 Minutes ?

## 2021-09-05 ENCOUNTER — Telehealth: Payer: Self-pay

## 2021-09-05 ENCOUNTER — Other Ambulatory Visit: Payer: Self-pay | Admitting: Physician Assistant

## 2021-09-05 ENCOUNTER — Ambulatory Visit: Payer: BC Managed Care – PPO | Admitting: Physician Assistant

## 2021-09-05 DIAGNOSIS — E039 Hypothyroidism, unspecified: Secondary | ICD-10-CM

## 2021-09-05 NOTE — Telephone Encounter (Signed)
Notified patient of ugi appointment date, time and prep-Toni ?

## 2021-09-23 DIAGNOSIS — Z8669 Personal history of other diseases of the nervous system and sense organs: Secondary | ICD-10-CM | POA: Diagnosis not present

## 2021-09-23 DIAGNOSIS — H2513 Age-related nuclear cataract, bilateral: Secondary | ICD-10-CM | POA: Diagnosis not present

## 2021-09-23 DIAGNOSIS — H40003 Preglaucoma, unspecified, bilateral: Secondary | ICD-10-CM | POA: Diagnosis not present

## 2021-09-29 ENCOUNTER — Other Ambulatory Visit: Payer: Self-pay | Admitting: Physician Assistant

## 2021-09-29 ENCOUNTER — Ambulatory Visit
Admission: RE | Admit: 2021-09-29 | Discharge: 2021-09-29 | Disposition: A | Discharge status: Home / Self Care | Payer: BC Managed Care – PPO | Source: Ambulatory Visit | Attending: Physician Assistant | Admitting: Physician Assistant

## 2021-09-29 DIAGNOSIS — R6339 Other feeding difficulties: Secondary | ICD-10-CM | POA: Diagnosis not present

## 2021-09-29 DIAGNOSIS — K219 Gastro-esophageal reflux disease without esophagitis: Secondary | ICD-10-CM

## 2021-10-06 ENCOUNTER — Telehealth: Payer: Self-pay

## 2021-10-06 ENCOUNTER — Ambulatory Visit: Payer: BC Managed Care – PPO | Admitting: Physician Assistant

## 2021-10-06 ENCOUNTER — Encounter: Payer: Self-pay | Admitting: Physician Assistant

## 2021-10-06 DIAGNOSIS — R1011 Right upper quadrant pain: Secondary | ICD-10-CM | POA: Diagnosis not present

## 2021-10-06 DIAGNOSIS — K219 Gastro-esophageal reflux disease without esophagitis: Secondary | ICD-10-CM | POA: Diagnosis not present

## 2021-10-06 DIAGNOSIS — R636 Underweight: Secondary | ICD-10-CM

## 2021-10-06 DIAGNOSIS — R110 Nausea: Secondary | ICD-10-CM | POA: Diagnosis not present

## 2021-10-06 MED ORDER — PNEUMOCOCCAL 20-VAL CONJ VACC 0.5 ML IM SUSY: 0.5000 mL | PREFILLED_SYRINGE | INTRAMUSCULAR | 0 refills | Status: AC

## 2021-10-06 NOTE — Progress Notes (Signed)
?[facility] ?[address] ?[place], [place] [address] ? ?Internal MEDICINE  ?Office Visit Note ? ?Patient Name: Regina Irwin ? [id number]  ?[id number] ? ?Date of Service: 10/08/2021 ? ?Chief Complaint  ?Patient presents with  ? GI Problem  ?  Results for upper GI  ? ? ? ?HPI ?Pt is here for a sick visit. ?-Upper GI showed mild GERD and laryngeal penetration without tracheal aspiration ?-Has had some difficulty with dry solids or at the dentist that she has difficulty swallowing, but would like to hold off on speech referral for now ?-Supplementing protein--quinoa and green tea protein without sweetener and adds this with ground pumpkin seeds to help keep weight up ?-does state omeprazole helps with her reflux and thinks maybe taking this and famotidine helped to lessen the severity of reflux seen on study, but she does not like to take the omeprazole as it can increase her nausea ?-Continues to feel pain in RUQ/epigastric area that goes through to shoulder/back when she bend over and has nausea ?-prior imaging studies show possible sludge in GB and warrants further investigation due to symptoms. Will order abdominal US and HIDA scan ?-BP and HR on the low side but denies any symptoms ? ?Current Medication: ? ?Outpatient Encounter Medications as of 10/06/2021  ?Medication Sig  ? alendronate (FOSAMAX) 70 MG tablet Take 1 tablet (70 mg total) by mouth every 7 (seven) days. Take with a full glass of water on an empty stomach.  ? Ca Phosphate-Cholecalciferol 200-200 MG-UNIT CHEW Chew by mouth.  ? Cholecalciferol (VITAMIN D3) 1000 units CAPS Take by mouth.  ? levothyroxine (SYNTHROID) 75 MCG tablet TAKE 1 TABLET BY MOUTH EVERY DAY BEFORE BREAKFAST  ? Multiple Vitamin (MULTI-VITAMINS) TABS Take by mouth.  ? omeprazole (PRILOSEC) 40 MG capsule Take 1 capsule (40 mg total) by mouth daily.  ? [DISCONTINUED] pneumococcal 20-valent conjugate vaccine (PREVNAR 20) 0.5 ML injection Inject 0.5 mLs into the muscle  tomorrow at 10 am.  ? [EXPIRED] pneumococcal 20-valent conjugate vaccine (PREVNAR 20) 0.5 ML injection Inject 0.5 mLs into the muscle tomorrow at 10 am for 1 dose.  ? ?No facility-administered encounter medications on file as of 10/06/2021.  ? ? ? ? ?Medical History: ?Past Medical History:  ?Diagnosis Date  ? GERD (gastroesophageal reflux disease)   ? Thyroid disease   ? ? ? ?Vital Signs: ?BP 100/64 Comment: 99/59  Pulse (!) 57   Temp 98.3 ?F (36.8 ?C)   Resp 16   Ht '5\' 4"'$  (1.626 m)   Wt 103 lb 3.2 oz (46.8 kg)   SpO2 97%   BMI 17.71 kg/m?  ? ? ?Review of Systems  ?Constitutional:  Negative for appetite change, chills, fatigue and unexpected weight change.  ?HENT:  Negative for congestion, postnasal drip, rhinorrhea, sneezing and sore throat.   ?Eyes:  Negative for redness.  ?Respiratory:  Negative for cough, chest tightness and shortness of breath.   ?Cardiovascular:  Negative for chest pain and palpitations.  ?Gastrointestinal:  Positive for abdominal pain and nausea. Negative for constipation, diarrhea and vomiting.  ?     Gas and reflux  ?Genitourinary:  Negative for dysuria and frequency.  ?Musculoskeletal:  Positive for back pain. Negative for arthralgias, joint swelling and neck pain.  ?Skin:  Negative for rash.  ?Neurological: Negative.  Negative for tremors and numbness.  ?Hematological:  Negative for adenopathy. Does not bruise/bleed easily.  ?Psychiatric/Behavioral:  Negative for behavioral problems (Depression), sleep disturbance and suicidal ideas. The patient is nervous/anxious.   ? ?  Physical Exam ?Vitals and nursing note reviewed.  ?Constitutional:   ?   General: She is not in acute distress. ?   Appearance: She is well-developed. She is not diaphoretic.  ?   Comments: Underweight  ?HENT:  ?   Head: Normocephalic and atraumatic.  ?   Right Ear: External ear normal.  ?   Left Ear: External ear normal.  ?   Nose: Nose normal.  ?Eyes:  ?   Conjunctiva/sclera: Conjunctivae normal.  ?   Pupils:  Pupils are equal, round, and reactive to light.  ?Neck:  ?   Thyroid: No thyromegaly.  ?   Vascular: No JVD.  ?   Trachea: No tracheal deviation.  ?Cardiovascular:  ?   Rate and Rhythm: Regular rhythm. Bradycardia present.  ?   Heart sounds: Normal heart sounds. No murmur heard. ?  No friction rub. No gallop.  ?Pulmonary:  ?   Effort: Pulmonary effort is normal. No respiratory distress.  ?   Breath sounds: Normal breath sounds. No stridor. No wheezing or rales.  ?Chest:  ?   Chest wall: No tenderness.  ?Abdominal:  ?   General: Bowel sounds are normal.  ?   Palpations: Abdomen is soft.  ?   Tenderness: There is abdominal tenderness.  ?Musculoskeletal:     ?   General: No tenderness or deformity. Normal range of motion.  ?   Cervical back: Normal range of motion and neck supple.  ?Lymphadenopathy:  ?   Cervical: No cervical adenopathy.  ?Skin: ?   General: Skin is warm and dry.  ?   Coloration: Skin is not pale.  ?   Findings: No erythema or rash.  ?Neurological:  ?   Mental Status: She is alert.  ?   Cranial Nerves: No cranial nerve deficit.  ?   Motor: No abnormal muscle tone.  ?   Coordination: Coordination normal.  ?   Deep Tendon Reflexes: Reflexes are normal and symmetric.  ?Psychiatric:     ?   Behavior: Behavior normal.     ?   Thought Content: Thought content normal.     ?   Judgment: Judgment normal.  ? ? ? ? ?Assessment/Plan: ?1. Gastroesophageal reflux disease, unspecified whether esophagitis present ?Will continue famotidine and omeprazole to help reflux, may considering stopping omeprazole if no further improvement with use. May need to see GI ? ?2. RUQ abdominal pain ?Will order [place] of abdomen to investigate further and plan for HIDA scan due to RUQ/epigastric pain radiating to back/right shoulder ?- NM Hepato W/Eject Fract; Future ?- US Abdomen Complete; Future ? ?3. Nausea ?Will order [place] of abdomen and plan for HIDA scan as well ?- NM Hepato W/Eject Fract; Future ?- US Abdomen Complete; Future ? ?4.  Underweight ?Increase caloric intake and protein ? ? ?General Counseling: yumi insalaco understanding of the findings of todays visit and agrees with plan of treatment. I have discussed any further diagnostic evaluation that may be needed or ordered today. We also reviewed her medications today. she has been encouraged to call the office with any questions or concerns that should arise related to todays visit. ? ? ? ?Counseling: ? ? ? ?Orders Placed This Encounter  ?Procedures  ? NM Hepato W/Eject Fract  ? US Abdomen Complete  ? ? ?Meds ordered this encounter  ?Medications  ? pneumococcal 20-valent conjugate vaccine (PREVNAR 20) 0.5 ML injection  ?  Sig: Inject 0.5 mLs into the muscle tomorrow at 10 am for 1  dose.  ?  Dispense:  0.5 mL  ?  Refill:  0  ? ? ?Time spent:30 Minutes ?

## 2021-10-06 NOTE — Telephone Encounter (Signed)
Pt called requesting results from her UGI, per Lauren pt showed signs of reflux and signs of  difficulty swallowing, and signs of possible aspiration.  If pt is having difficulty then Lauren can place a referral for speech therapy.   Pt was c/o still having pain in sternum which is positional when bending over, mostly in the afternoon.  Spoke to Dr Humphrey Rolls and she asked for pt to be schedule an appt so pt is coming in 10/06/21  ?

## 2021-10-06 NOTE — Telephone Encounter (Signed)
error 

## 2021-10-13 ENCOUNTER — Ambulatory Visit: Payer: BC Managed Care – PPO

## 2021-10-13 DIAGNOSIS — R1011 Right upper quadrant pain: Secondary | ICD-10-CM

## 2021-10-13 DIAGNOSIS — R110 Nausea: Secondary | ICD-10-CM | POA: Diagnosis not present

## 2021-11-03 ENCOUNTER — Encounter: Payer: Self-pay | Admitting: Physician Assistant

## 2021-11-03 ENCOUNTER — Ambulatory Visit: Payer: BC Managed Care – PPO | Admitting: Physician Assistant

## 2021-11-03 VITALS — BP 104/59 | HR 52 | Temp 98.5°F | Resp 16 | Ht 64.0 in | Wt 103.2 lb

## 2021-11-03 DIAGNOSIS — R110 Nausea: Secondary | ICD-10-CM | POA: Diagnosis not present

## 2021-11-03 DIAGNOSIS — R1011 Right upper quadrant pain: Secondary | ICD-10-CM | POA: Diagnosis not present

## 2021-11-03 NOTE — Progress Notes (Signed)
[facility] [place], [place] [address]  Internal MEDICINE  Office Visit Note  Patient Name: Regina Irwin  [id number]  [id number]  Date of Service: 11/05/2021  Chief Complaint  Patient presents with   Follow-up    Review ultrasound   Gastroesophageal Reflux   Constipation    Depends on certain foods    HPI Pt is here for follow up to review abdominal US -[place] reviewed and showed normal liver with mild fatty change. Hemangiomas less clearly seen than on prior exams. Normal gallbladder without evidence of stones or thickening. Normal bile duct and no other focal abnormality. -Continues to have RUQ and mid epigastric pain. Can vary with foods. Continues to have nausea, bloating, and reflux. 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 Take with a full glass of water on an empty stomach.   Ca Phosphate-Cholecalciferol 200-200 MG-UNIT CHEW Chew by mouth.   Cholecalciferol (VITAMIN D3) 1000 units CAPS Take by mouth.   levothyroxine (SYNTHROID) 75 MCG tablet TAKE 1 TABLET BY MOUTH EVERY DAY BEFORE BREAKFAST   Multiple Vitamin (MULTI-VITAMINS) TABS Take by mouth.   omeprazole (PRILOSEC) 40 MG capsule Take 1 capsule (40 mg total) by mouth daily.   No facility-administered encounter medications on file as of 11/03/2021.    Surgical History: Past Surgical History:  Procedure Laterality Date   tumor removed  right foot      Medical History: Past Medical History:  Diagnosis Date   GERD (gastroesophageal reflux disease)    Thyroid disease     Family History: Family History  Problem Relation Age of Onset   Diabetes Father    Lung cancer Father    Drug abuse  Paternal Grandmother    Drug abuse Paternal Grandfather    Gallstones Mother    Melanoma Mother    Melanoma Brother    Cancer Maternal Grandmother     Social History   Socioeconomic History   Marital status: Single    Spouse name: Not on file   Number of children: Not on file   Years of education: Not on file   Highest education level: Not on file  Occupational History   Not on file  Tobacco Use   Smoking status: Never   Smokeless tobacco: Never  Vaping Use   Vaping Use: Never used  Substance and Sexual Activity   Alcohol use: Never   Drug use: Never   Sexual activity: Not on file  Other Topics Concern   Not on file  Social History Narrative   Not on file   Social Determinants of Health   Financial Resource Strain: Not on file  Food Insecurity: Not on file  Transportation Needs: Not on file  Physical Activity: Not on file  Stress: Not on file  Social Connections: Not on file  Intimate Partner Violence: Not on file      Review of Systems  Constitutional:  Negative for appetite change, chills, fatigue and unexpected weight change.  HENT:  Negative for congestion, postnasal drip, rhinorrhea, sneezing and sore throat.   Eyes:  Negative for redness.  Respiratory:  Negative for cough, chest tightness and shortness of breath.   Cardiovascular:  Negative for chest pain and palpitations.  Gastrointestinal:  Positive for abdominal pain and nausea. Negative for constipation, diarrhea and vomiting.       Gas and reflux  Genitourinary:  Negative for dysuria and frequency.  Musculoskeletal:  Positive for back pain. Negative for arthralgias, joint swelling and neck pain.  Skin:  Negative for rash.  Neurological: Negative.  Negative for tremors and numbness.  Hematological:  Negative for adenopathy. Does not bruise/bleed easily.  Psychiatric/Behavioral:  Negative for behavioral problems (Depression), sleep disturbance and suicidal ideas. The patient is nervous/anxious.     Vital Signs: BP (!) 104/59   Pulse (!) 52   Temp 98.5 F (36.9 C)   Resp 16   Ht '5\' 4"'$  (1.626 m)   Wt 103 lb 3.2 oz (46.8 kg)   SpO2 97%   BMI 17.71 kg/m    Physical Exam Vitals and nursing note reviewed.  Constitutional:      General: She is not in acute distress.    Appearance: She is well-developed. She is not diaphoretic.     Comments: Underweight  HENT:     Head: Normocephalic and atraumatic.     Right Ear: External ear normal.     Left Ear: External ear normal.     Nose: Nose normal.  Eyes:     Conjunctiva/sclera: Conjunctivae normal.     Pupils: Pupils are equal, round, and reactive to light.  Neck:     Thyroid: No thyromegaly.     Vascular: No JVD.     Trachea: No tracheal deviation.  Cardiovascular:     Rate and Rhythm: Regular rhythm. Bradycardia present.     Heart sounds: Normal heart sounds. No murmur heard.   No friction rub. No gallop.  Pulmonary:     Effort: Pulmonary effort is normal. No respiratory distress.     Breath sounds: Normal breath sounds. No stridor. No wheezing or rales.  Chest:     Chest wall: No tenderness.  Abdominal:     General: Bowel sounds are normal.     Palpations: Abdomen is soft.     Tenderness: There is abdominal tenderness.  Musculoskeletal:        General: No tenderness or deformity. Normal range of motion.     Cervical back: Normal range of motion and neck supple.  Lymphadenopathy:     Cervical: No cervical adenopathy.  Skin:    General: Skin is warm and dry.     Coloration: Skin is not pale.     Findings: No erythema or rash.  Neurological:     Mental Status: She is alert.     Cranial Nerves: No cranial nerve deficit.     Motor: No abnormal muscle tone.     Coordination: Coordination normal.     Deep Tendon Reflexes: Reflexes are normal and symmetric.  Psychiatric:        Behavior: Behavior normal.        Thought Content: Thought content normal.        Judgment: Judgment normal.        Assessment/Plan: 1. RUQ abdominal pain Will order HIDA scan for further investigation - NM Hepato W/EF; Future  2. Nausea Will order HIDA scan for further investigation - NM Hepato W/EF; Future   General Counseling: solene hereford understanding of the findings of todays visit and agrees with plan of treatment. I have discussed any further diagnostic evaluation that may be needed or ordered today. We also reviewed her medications today. she has been encouraged to call the office with any questions or  concerns that should arise related to todays visit.    Orders Placed This Encounter  Procedures   NM Hepato W/EF    No orders of the defined types were placed in this encounter.   This patient was seen by Drema Dallas, PA-C in collaboration with Dr. Clayborn Bigness as a part of collaborative care agreement.   Total time spent:30 Minutes Time spent includes review of chart, medications, test results, and follow up plan with the patient.      Dr Lavera Guise Internal medicine

## 2021-11-05 ENCOUNTER — Telehealth: Payer: Self-pay

## 2021-11-05 NOTE — Telephone Encounter (Signed)
Faxed office notes and u/s report for NM Hepato prior approval to [facility] ?

## 2021-11-06 NOTE — Telephone Encounter (Signed)
Test approved. Scheduled appointment. Lvm notifying patient of appointment date and time and npo 6 hours prior-Toni

## 2021-11-24 ENCOUNTER — Other Ambulatory Visit: Payer: BC Managed Care – PPO

## 2021-11-28 ENCOUNTER — Ambulatory Visit
Admission: RE | Admit: 2021-11-28 | Discharge: 2021-11-28 | Disposition: A | Discharge status: Home / Self Care | Payer: BC Managed Care – PPO | Source: Ambulatory Visit | Attending: Physician Assistant | Admitting: Physician Assistant

## 2021-11-28 DIAGNOSIS — R1011 Right upper quadrant pain: Secondary | ICD-10-CM | POA: Diagnosis not present

## 2021-11-28 DIAGNOSIS — R110 Nausea: Secondary | ICD-10-CM | POA: Diagnosis not present

## 2021-11-28 MED ORDER — TECHNETIUM TC 99M MEBROFENIN IV KIT
5.0000 | PACK | Freq: Once | INTRAVENOUS | Status: AC | PRN
  Administered 2021-11-28: 5.55 via INTRAVENOUS

## 2021-12-01 ENCOUNTER — Telehealth: Payer: Self-pay

## 2021-12-01 NOTE — Telephone Encounter (Signed)
Pt advised that Hida scan  is normal

## 2021-12-02 ENCOUNTER — Encounter: Payer: Self-pay | Admitting: Internal Medicine

## 2021-12-02 ENCOUNTER — Ambulatory Visit: Payer: BC Managed Care – PPO | Admitting: Internal Medicine

## 2021-12-02 VITALS — BP 96/60 | HR 84 | Temp 97.7°F | Resp 16 | Ht 64.0 in | Wt 102.2 lb

## 2021-12-02 DIAGNOSIS — M810 Age-related osteoporosis without current pathological fracture: Secondary | ICD-10-CM

## 2021-12-02 DIAGNOSIS — K219 Gastro-esophageal reflux disease without esophagitis: Secondary | ICD-10-CM

## 2021-12-02 DIAGNOSIS — R634 Abnormal weight loss: Secondary | ICD-10-CM

## 2021-12-02 MED ORDER — OMEPRAZOLE 40 MG PO CPDR: DELAYED_RELEASE_CAPSULE | ORAL | 1 refills | Status: DC

## 2021-12-02 MED ORDER — FAMOTIDINE 20 MG PO TABS: 20.0000 mg | ORAL_TABLET | Freq: Two times a day (BID) | ORAL | 3 refills | Status: DC

## 2021-12-02 NOTE — Progress Notes (Signed)
[facility] [place], [place] [address]  Internal MEDICINE  Office Visit Note  Patient Name: Regina Irwin  [id number]  [id number]  Date of Service: 12/02/2021  Chief Complaint  Patient presents with   Acute Visit    Frequent loose bowel movements, no pain or blood in stool - occurs once a day; pt also gets some stomach discomfort 4 hours after eating    HPI  Patient is seen in follow-up on her HIDA scan which is normal. She has been suffering from severe heartburn especially at night, her upper GI is normal however she is not consistently taking her Prilosec, patient is on Fosamax as well. Patient did have an ultrasound of the abdomen which was normal, her HIDA scan is also normal. Her other symptoms are having gas and burping all the time, she complains of not formed stools denies any mucus or blood, does have abdominal cramps. Patient has suffered from GI symptoms over.  Of time however since last 1 year it has been getting worse she has lost between 8 to 10 pounds in 1 year. Patient works full-time denies any depression   Current Medication: Outpatient Encounter Medications as of 12/02/2021  Medication Sig   alendronate (FOSAMAX) 70 MG tablet Take 1 tablet (70 mg total) by mouth every 7 (seven) days. Take with a full glass of water on an empty stomach.   Ca Phosphate-Cholecalciferol 200-200 MG-UNIT CHEW Chew by mouth.   Cholecalciferol (VITAMIN D3) 1000 units CAPS Take by mouth.   famotidine (PEPCID) 20 MG tablet Take 1 tablet (20 mg total) by mouth 2 (two) times daily.   levothyroxine (SYNTHROID) 75 MCG tablet TAKE 1 TABLET BY MOUTH EVERY DAY BEFORE BREAKFAST   Multiple Vitamin (MULTI-VITAMINS) TABS Take by mouth.   omeprazole (PRILOSEC) 40 MG capsule Take one tab po bid   [DISCONTINUED] omeprazole (PRILOSEC) 40 MG capsule Take 1 capsule (40 mg total) by mouth daily.   No facility-administered encounter medications on file as of 12/02/2021.     Surgical History: Past Surgical History:  Procedure Laterality Date   tumor removed  right foot      Medical History: Past Medical History:  Diagnosis Date   GERD (gastroesophageal reflux disease)    Thyroid disease     Family History: Family History  Problem Relation Age of Onset   Diabetes Father    Lung cancer Father    Drug abuse Paternal Grandmother    Drug abuse Paternal Grandfather    Gallstones Mother    Melanoma Mother    Melanoma Brother    Cancer Maternal Grandmother     Social History   Socioeconomic History   Marital status: Single    Spouse name: Not on file   Number of children: Not on file   Years of education: Not on file   Highest education level: Not on file  Occupational History   Not on file  Tobacco Use   Smoking status: Never   Smokeless tobacco: Never  Vaping Use   Vaping Use: Never used  Substance and Sexual Activity   Alcohol use: Never   Drug use: Never   Sexual activity: Not on file  Other Topics Concern   Not on file  Social History Narrative   Not on file   Social Determinants of Health   Financial Resource Strain: Not on file  Food Insecurity: Not on file  Transportation Needs: Not on file  Physical Activity: Not on file  Stress: Not on  file  Social Connections: Not on file  Intimate Partner Violence: Not on file      Review of Systems  Constitutional:  Positive for unexpected weight change. Negative for fatigue.  HENT:  Negative for congestion, mouth sores and postnasal drip.   Respiratory:  Negative for cough.   Cardiovascular:  Negative for chest pain.  Gastrointestinal:  Positive for abdominal pain.       Heartburn   Genitourinary:  Negative for flank pain.  Psychiatric/Behavioral: Negative.      Vital Signs: BP 96/60   Pulse 84   Temp 97.7 F (36.5 C)   Resp 16   Ht '5\' 4"'$  (1.626 m)   Wt 102 lb 3.2 oz (46.4 kg)   SpO2 99%   BMI 17.54 kg/m    Physical Exam Constitutional:      Appearance:  Normal appearance.  HENT:     Head: Normocephalic and atraumatic.     Nose: Nose normal.     Mouth/Throat:     Mouth: Mucous membranes are moist.     Pharynx: No posterior oropharyngeal erythema.  Eyes:     Extraocular Movements: Extraocular movements intact.     Pupils: Pupils are equal, round, and reactive to light.  Cardiovascular:     Pulses: Normal pulses.     Heart sounds: Normal heart sounds.  Pulmonary:     Effort: Pulmonary effort is normal.     Breath sounds: Normal breath sounds.  Neurological:     General: No focal deficit present.     Mental Status: She is alert.  Psychiatric:        Mood and Affect: Mood normal.        Behavior: Behavior normal.        Assessment/Plan: 1. Gastroesophageal reflux disease without esophagitis Worsening symptoms, unable to sleep at night patient is instructed an emphasis on taking her Prilosec twice a day we will also add famotidine we will stop her Fosamax for now which can make GI symptoms worse, patient also needs to see GI for ongoing symptoms - Ambulatory referral to Gastroenterology - famotidine (PEPCID) 20 MG tablet; Take 1 tablet (20 mg total) by mouth 2 (two) times daily.  Dispense: 180 tablet; Refill: 3 - omeprazole (PRILOSEC) 40 MG capsule; Take one tab po bid  Dispense: 180 capsule; Refill: 1  2. Abnormal weight loss Patient has loose stools heartburn and abnormal weight loss she needs to see GI for further work-up, might need to do gastrin levels - Ambulatory referral to Gastroenterology  3. Senile osteoporosis Hold Fosamax for now due to worsening GI symptoms might need IV phosphonate therapy however we will evaluate that in the future, patient might need to see endocrinology for it  General Counseling: jaritza duignan understanding of the findings of todays visit and agrees with plan of treatment. I have discussed any further diagnostic evaluation that may be needed or ordered today. We also reviewed her medications  today. she has been encouraged to call the office with any questions or concerns that should arise related to todays visit.    Orders Placed This Encounter  Procedures   Ambulatory referral to Gastroenterology    Meds ordered this encounter  Medications   famotidine (PEPCID) 20 MG tablet    Sig: Take 1 tablet (20 mg total) by mouth 2 (two) times daily.    Dispense:  180 tablet    Refill:  3   omeprazole (PRILOSEC) 40 MG capsule    Sig: Take one tab  po bid    Dispense:  180 capsule    Refill:  1    Total time spent:35 Minutes Time spent includes review of chart, medications, test results, and follow up plan with the patient.   [place] Controlled Substance Database was reviewed by me.   Dr Lavera Guise Internal medicine

## 2021-12-11 ENCOUNTER — Telehealth: Payer: Self-pay

## 2021-12-11 NOTE — Telephone Encounter (Signed)
Patient called stating [facility] has her scheduled for October. I lvm with office to see about getting her in sooner since referral sent via Epic was as an urgent referral-Toni

## 2021-12-18 ENCOUNTER — Telehealth: Payer: Self-pay | Admitting: Gastroenterology

## 2021-12-18 NOTE — Telephone Encounter (Signed)
Patient left vm stating she got a letter in the mail from [place] that we received a referral and was trying to schedule an appt. Patient was confused and called to confirm the appt she has on 12/29/2021 at 1:45pm. I called patient back and left a vm confirming that she does have an appt on this date and at this time (12/29/2021 @ 1:45pm)

## 2021-12-28 ENCOUNTER — Other Ambulatory Visit: Payer: Self-pay | Admitting: Physician Assistant

## 2021-12-28 DIAGNOSIS — K219 Gastro-esophageal reflux disease without esophagitis: Secondary | ICD-10-CM

## 2021-12-29 ENCOUNTER — Ambulatory Visit: Payer: BC Managed Care – PPO | Admitting: Gastroenterology

## 2021-12-29 ENCOUNTER — Other Ambulatory Visit: Payer: Self-pay

## 2021-12-29 ENCOUNTER — Encounter: Payer: Self-pay | Admitting: Gastroenterology

## 2021-12-29 VITALS — BP 110/67 | HR 51 | Temp 98.2°F | Ht 64.0 in | Wt 105.0 lb

## 2021-12-29 DIAGNOSIS — R1310 Dysphagia, unspecified: Secondary | ICD-10-CM

## 2021-12-29 DIAGNOSIS — K219 Gastro-esophageal reflux disease without esophagitis: Secondary | ICD-10-CM

## 2021-12-29 DIAGNOSIS — R195 Other fecal abnormalities: Secondary | ICD-10-CM

## 2021-12-29 DIAGNOSIS — Z1211 Encounter for screening for malignant neoplasm of colon: Secondary | ICD-10-CM

## 2021-12-29 MED ORDER — NA SULFATE-K SULFATE-MG SULF 17.5-3.13-1.6 GM/177ML PO SOLN: 354.0000 mL | Freq: Once | ORAL | 0 refills | Status: AC

## 2021-12-29 NOTE — Progress Notes (Signed)
Cephas Darby, MD [address]  [place]  [place], [place] [address]  Main: [phone]  Fax: [phone]    Gastroenterology Consultation  Referring Provider:     Lavera Guise, MD Primary Care Physician:  Mylinda Latina, PA-C Primary Gastroenterologist:  Dr. Cephas Darby Reason for Consultation: Unexplained weight loss, GERD        HPI:   Regina Irwin is a 66 y.o. female referred by Dr. Mylinda Latina, PA-C  for consultation & management of unexplained weight loss.  Patient reports history of severe heartburn, nocturnal despite being head of the bed elevated.  Patient is started on Prilosec, not taking daily because she is concerned about the side effects.  Patient underwent abdominal ultrasound as well as HIDA scan which were unremarkable.  Patient has been experiencing nonbloody loose stools, associated with gas and bloating.  Patient lost about 8 to 10 pounds within last 1 year.  Labs CBC normal Free T4, low TSH, no evidence of anemia, normal CMP, HbA1c  Patient never smoked in her life, does not drink alcohol  NSAIDs: None  Antiplts/Anticoagulants/Anti thrombotics: None  GI Procedures: None  Past Medical History:  Diagnosis Date   GERD (gastroesophageal reflux disease)    Thyroid disease     Past Surgical History:  Procedure Laterality Date   tumor removed  right foot       Current Outpatient Medications:    Ca Phosphate-Cholecalciferol 200-200 MG-UNIT CHEW, Chew by mouth., Disp: , Rfl:    famotidine (PEPCID) 20 MG tablet, Take 1 tablet (20 mg total) by mouth 2 (two) times daily., Disp: 180 tablet, Rfl: 3   levothyroxine (SYNTHROID) 75 MCG tablet, TAKE 1 TABLET BY MOUTH EVERY DAY BEFORE BREAKFAST, Disp: 30 tablet, Rfl: 5   Multiple Vitamin (MULTI-VITAMINS) TABS, Take by mouth., Disp: , Rfl:    omeprazole (PRILOSEC) 40 MG capsule, TAKE 1 CAPSULE (40 MG TOTAL) BY MOUTH DAILY., Disp: 30 capsule, Rfl: 3   alendronate (FOSAMAX) 70 MG tablet,  Take 1 tablet (70 mg total) by mouth every 7 (seven) days. Take with a full glass of water on an empty stomach. (Patient not taking: Reported on 12/29/2021), Disp: 4 tablet, Rfl: 11   Cholecalciferol (VITAMIN D3) 1000 units CAPS, Take by mouth. (Patient not taking: Reported on 12/29/2021), Disp: , Rfl:    Family History  Problem Relation Age of Onset   Diabetes Father    Lung cancer Father    Drug abuse Paternal Grandmother    Drug abuse Paternal Grandfather    Gallstones Mother    Melanoma Mother    Melanoma Brother    Cancer Maternal Grandmother      Social History   Tobacco Use   Smoking status: Never   Smokeless tobacco: Never  Vaping Use   Vaping Use: Never used  Substance Use Topics   Alcohol use: Never   Drug use: Never    Allergies as of 12/29/2021   (No Known Allergies)    Review of Systems:    All systems reviewed and negative except where noted in HPI.   Physical Exam:  BP 110/67 (BP Location: Left Arm, Patient Position: Sitting, Cuff Size: Normal)   Pulse (!) 51   Temp 98.2 F (36.8 C) (Oral)   Ht '5\' 4"'$  (1.626 m)   Wt 105 lb (47.6 kg)   BMI 18.02 kg/m  No LMP recorded. Patient is postmenopausal.  General:   Alert,  Well-developed, well-nourished, pleasant and cooperative  in NAD Head:  Normocephalic and atraumatic. Eyes:  Sclera clear, no icterus.   Conjunctiva pink. Ears:  Normal auditory acuity. Nose:  No deformity, discharge, or lesions. Mouth:  No deformity or lesions,oropharynx pink & moist. Neck:  Supple; no masses or thyromegaly. Lungs:  Respirations even and unlabored.  Clear throughout to auscultation.   No wheezes, crackles, or rhonchi. No acute distress. Heart:  Regular rate and rhythm; no murmurs, clicks, rubs, or gallops. Abdomen:  Normal bowel sounds. Soft, non-tender and non-distended without masses, hepatosplenomegaly or hernias noted.  No guarding or rebound tenderness.   Rectal: Not performed Msk:  Symmetrical without gross  deformities. Good, equal movement & strength bilaterally. Pulses:  Normal pulses noted. Extremities:  No clubbing or edema.  No cyanosis. Neurologic:  Alert and oriented x3;  grossly normal neurologically. Skin:  Intact without significant lesions or rashes. No jaundice. Psych:  Alert and cooperative. Normal mood and affect.  Imaging Studies: Reviewed  Assessment and Plan:   Regina Irwin is a 66 y.o. female with history of chronic GERD is seen in consultation for flareup of GERD and unintentional weight loss, nonbloody loose stools  Recommend GI profile PCR and pancreatic fecal elastase levels Recommend upper endoscopy as well as colonoscopy with possible TI evaluation  Follow up based on the above work-up   Cephas Darby, MD

## 2021-12-30 ENCOUNTER — Encounter: Payer: Self-pay | Admitting: Gastroenterology

## 2022-01-23 DIAGNOSIS — R195 Other fecal abnormalities: Secondary | ICD-10-CM | POA: Diagnosis not present

## 2022-01-23 DIAGNOSIS — R197 Diarrhea, unspecified: Secondary | ICD-10-CM | POA: Diagnosis not present

## 2022-01-27 LAB — GI PROFILE, STOOL, PCR

## 2022-01-27 LAB — PANCREATIC ELASTASE, FECAL: Pancreatic Elastase, Fecal: >500 ug Elast./g (ref >200)

## 2022-01-28 ENCOUNTER — Telehealth: Payer: Self-pay

## 2022-01-28 NOTE — Telephone Encounter (Signed)
Called and sister states she works every day Monday through Friday from 8 to 5 and there is no other number I can call. Sending letter

## 2022-01-28 NOTE — Telephone Encounter (Signed)
-----   Message from Lin Landsman, MD sent at 01/27/2022  4:14 PM EDT ----- The stool studies came back normal.  I will see her for upper endoscopy and colonoscopy  RV

## 2022-01-29 ENCOUNTER — Encounter: Payer: BC Managed Care – PPO | Admitting: Physician Assistant

## 2022-02-03 DIAGNOSIS — H40023 Open angle with borderline findings, high risk, bilateral: Secondary | ICD-10-CM | POA: Diagnosis not present

## 2022-02-13 ENCOUNTER — Encounter: Payer: Self-pay | Admitting: Gastroenterology

## 2022-02-16 ENCOUNTER — Encounter: Payer: Self-pay | Admitting: Gastroenterology

## 2022-02-16 ENCOUNTER — Encounter
Admission: RE | Disposition: A | Discharge status: Home / Self Care | Payer: Self-pay | Source: Ambulatory Visit | Attending: Gastroenterology

## 2022-02-16 ENCOUNTER — Ambulatory Visit: Payer: BC Managed Care – PPO | Admitting: Anesthesiology

## 2022-02-16 ENCOUNTER — Ambulatory Visit
Admission: RE | Admit: 2022-02-16 | Discharge: 2022-02-16 | Disposition: A | Discharge status: Home / Self Care | Payer: BC Managed Care – PPO | Source: Ambulatory Visit | Attending: Gastroenterology | Admitting: Gastroenterology

## 2022-02-16 ENCOUNTER — Encounter: Payer: BC Managed Care – PPO | Admitting: Physician Assistant

## 2022-02-16 DIAGNOSIS — K219 Gastro-esophageal reflux disease without esophagitis: Secondary | ICD-10-CM

## 2022-02-16 DIAGNOSIS — K529 Noninfective gastroenteritis and colitis, unspecified: Secondary | ICD-10-CM | POA: Diagnosis not present

## 2022-02-16 DIAGNOSIS — R634 Abnormal weight loss: Secondary | ICD-10-CM

## 2022-02-16 DIAGNOSIS — D123 Benign neoplasm of transverse colon: Secondary | ICD-10-CM | POA: Insufficient documentation

## 2022-02-16 DIAGNOSIS — Z681 Body mass index (BMI) 19 or less, adult: Secondary | ICD-10-CM | POA: Diagnosis not present

## 2022-02-16 DIAGNOSIS — R1310 Dysphagia, unspecified: Secondary | ICD-10-CM

## 2022-02-16 DIAGNOSIS — K635 Polyp of colon: Secondary | ICD-10-CM | POA: Insufficient documentation

## 2022-02-16 DIAGNOSIS — E039 Hypothyroidism, unspecified: Secondary | ICD-10-CM | POA: Diagnosis not present

## 2022-02-16 DIAGNOSIS — K2289 Other specified disease of esophagus: Secondary | ICD-10-CM | POA: Insufficient documentation

## 2022-02-16 DIAGNOSIS — Z1211 Encounter for screening for malignant neoplasm of colon: Secondary | ICD-10-CM | POA: Diagnosis not present

## 2022-02-16 DIAGNOSIS — K5730 Diverticulosis of large intestine without perforation or abscess without bleeding: Secondary | ICD-10-CM | POA: Diagnosis not present

## 2022-02-16 DIAGNOSIS — K644 Residual hemorrhoidal skin tags: Secondary | ICD-10-CM | POA: Insufficient documentation

## 2022-02-16 HISTORY — PX: ESOPHAGOGASTRODUODENOSCOPY (EGD) WITH PROPOFOL: SHX5813

## 2022-02-16 HISTORY — PX: COLONOSCOPY WITH PROPOFOL: SHX5780

## 2022-02-16 SURGERY — COLONOSCOPY WITH PROPOFOL
Anesthesia: General

## 2022-02-16 MED ORDER — PROPOFOL 10 MG/ML IV BOLUS
INTRAVENOUS | Status: AC
  Filled 2022-02-16: qty 20

## 2022-02-16 MED ORDER — PROPOFOL 1000 MG/100ML IV EMUL
INTRAVENOUS | Status: AC
  Filled 2022-02-16: qty 100

## 2022-02-16 MED ORDER — GLYCOPYRROLATE 0.2 MG/ML IJ SOLN
INTRAMUSCULAR | Status: AC
  Filled 2022-02-16: qty 1

## 2022-02-16 MED ORDER — LIDOCAINE HCL (CARDIAC) PF 100 MG/5ML IV SOSY
PREFILLED_SYRINGE | INTRAVENOUS | Status: DC | PRN
  Administered 2022-02-16: 50 mg via INTRAVENOUS

## 2022-02-16 MED ORDER — SODIUM CHLORIDE 0.9 % IV SOLN: INTRAVENOUS | Status: DC

## 2022-02-16 MED ORDER — PROPOFOL 10 MG/ML IV BOLUS
INTRAVENOUS | Status: DC | PRN
  Administered 2022-02-16: 60 mg via INTRAVENOUS
  Administered 2022-02-16: 20 mg via INTRAVENOUS

## 2022-02-16 MED ORDER — PROPOFOL 500 MG/50ML IV EMUL
INTRAVENOUS | Status: DC | PRN
  Administered 2022-02-16: 100 ug/kg/min via INTRAVENOUS

## 2022-02-16 MED ORDER — GLYCOPYRROLATE 0.2 MG/ML IJ SOLN
INTRAMUSCULAR | Status: DC | PRN
  Administered 2022-02-16: .2 mg via INTRAVENOUS

## 2022-02-16 NOTE — Anesthesia Postprocedure Evaluation (Signed)
Anesthesia Post Note  Patient: Regina Irwin  Procedure(s) Performed: COLONOSCOPY WITH PROPOFOL ESOPHAGOGASTRODUODENOSCOPY (EGD) WITH PROPOFOL  Patient location during evaluation: Endoscopy Anesthesia Type: General Level of consciousness: awake and alert Pain management: pain level controlled Vital Signs Assessment: post-procedure vital signs reviewed and stable Respiratory status: spontaneous breathing, nonlabored ventilation, respiratory function stable and patient connected to nasal cannula oxygen Cardiovascular status: blood pressure returned to baseline and stable Postop Assessment: no apparent nausea or vomiting Anesthetic complications: no   No notable events documented.   Last Vitals:  Vitals:   02/16/22 1153 02/16/22 1203  BP: 101/73 139/71  Pulse: (!) 51 (!) 40  Resp: 19 19  Temp:    SpO2: 98% 98%    Last Pain:  Vitals:   02/16/22 1153  TempSrc:   PainSc: 0-No pain                 Precious Haws [facility]

## 2022-02-16 NOTE — Anesthesia Preprocedure Evaluation (Signed)
Anesthesia Evaluation  Patient identified by MRN, date of birth, ID band Patient awake    Reviewed: Allergy & Precautions, NPO status , Patient's Chart, lab work & pertinent test results  History of Anesthesia Complications Negative for: history of anesthetic complications  Airway Mallampati: III  TM Distance: <3 FB Neck ROM: full    Dental  (+) Caps, Poor Dentition, Chipped   Pulmonary neg pulmonary ROS, neg shortness of breath,    Pulmonary exam normal        Cardiovascular Exercise Tolerance: Good (-) angina(-) Past MI negative cardio ROS Normal cardiovascular exam     Neuro/Psych negative neurological ROS  negative psych ROS   GI/Hepatic Neg liver ROS, GERD  Controlled,  Endo/Other  Hypothyroidism   Renal/GU negative Renal ROS  negative genitourinary   Musculoskeletal   Abdominal   Peds  Hematology negative hematology ROS (+)   Anesthesia Other Findings Past Medical History: No date: GERD (gastroesophageal reflux disease) No date: Thyroid disease  Past Surgical History: No date: OTHER SURGICAL HISTORY; Left     Comment:  bursal sac removed from finger No date: tumor removed  right foot  BMI    Body Mass Index: 17.16 kg/m      Reproductive/Obstetrics negative OB ROS                             Anesthesia Physical Anesthesia Plan  ASA: 3  Anesthesia Plan: General   Post-op Pain Management:    Induction: Intravenous  PONV Risk Score and Plan: Propofol infusion and TIVA  Airway Management Planned: Natural Airway and Nasal Cannula  Additional Equipment:   Intra-op Plan:   Post-operative Plan:   Informed Consent: I have reviewed the patients History and Physical, chart, labs and discussed the procedure including the risks, benefits and alternatives for the proposed anesthesia with the patient or authorized representative who has indicated his/her understanding and  acceptance.     Dental Advisory Given  Plan Discussed with: Anesthesiologist, CRNA and Surgeon  Anesthesia Plan Comments: (Patient consented for risks of anesthesia including but not limited to:  - adverse reactions to medications - risk of airway placement if required - damage to eyes, teeth, lips or other oral mucosa - nerve damage due to positioning  - sore throat or hoarseness - Damage to heart, brain, nerves, lungs, other parts of body or loss of life  Patient voiced understanding.)        Anesthesia Quick Evaluation

## 2022-02-16 NOTE — Op Note (Signed)
[facility] Gastroenterology Patient Name: Regina Irwin Procedure Date: 02/16/2022 10:44 AM MRN: [id number] Account #: [id number] Date of Birth: 10/30/1955 Admit Type: Outpatient Age: 66 Room: [facility] ENDO ROOM 4 Gender: Female Note Status: Finalized Instrument Name: Upper Endoscope [id number] Procedure:             Upper GI endoscopy Indications:           Diarrhea, Weight loss Providers:             Lin Landsman MD, MD Medicines:             General Anesthesia Complications:         No immediate complications. Estimated blood loss: None. Procedure:             Pre-Anesthesia Assessment:                        - Prior to the procedure, a History and Physical was                         performed, and patient medications and allergies were                         reviewed. The patient is competent. The risks and                         benefits of the procedure and the sedation options and                         risks were discussed with the patient. All questions                         were answered and informed consent was obtained.                         Patient identification and proposed procedure were                         verified by the physician, the nurse, the                         anesthesiologist, the anesthetist and the technician                         in the pre-procedure area in the procedure room in the                         endoscopy suite. Mental Status Examination: alert and                         oriented. Airway Examination: normal oropharyngeal                         airway and neck mobility. Respiratory Examination:                         clear to auscultation. CV Examination: normal.                         Prophylactic Antibiotics:  The patient does not require                         prophylactic antibiotics. Prior Anticoagulants: The                         patient has taken no previous anticoagulant or                          antiplatelet agents. ASA Grade Assessment: III - A                         patient with severe systemic disease. After reviewing                         the risks and benefits, the patient was deemed in                         satisfactory condition to undergo the procedure. The                         anesthesia plan was to use general anesthesia.                         Immediately prior to administration of medications,                         the patient was re-assessed for adequacy to receive                         sedatives. The heart rate, respiratory rate, oxygen                         saturations, blood pressure, adequacy of pulmonary                         ventilation, and response to care were monitored                         throughout the procedure. The physical status of the                         patient was re-assessed after the procedure.                        After obtaining informed consent, the endoscope was                         passed under direct vision. Throughout the procedure,                         the patient's blood pressure, pulse, and oxygen                         saturations were monitored continuously. The Endoscope                         was introduced through the mouth, and advanced to the  second part of duodenum. The upper GI endoscopy was                         accomplished without difficulty. The patient tolerated                         the procedure well. Findings:      The duodenal bulb and second portion of the duodenum were normal.       Biopsies were taken with a cold forceps for histology.      The entire examined stomach was normal. Biopsies were taken with a cold       forceps for histology.      The cardia and gastric fundus were normal on retroflexion.      Esophagogastric landmarks were identified: the gastroesophageal junction       was found at 40 cm from the incisors.      The gastroesophageal  junction and examined esophagus were normal.       Biopsies were taken with a cold forceps for histology at the GE junction       for localized abnormal mucosa. Impression:            - Normal duodenal bulb and second portion of the                         duodenum. Biopsied.                        - Normal stomach. Biopsied.                        - Esophagogastric landmarks identified.                        - Normal gastroesophageal junction and esophagus.                         Biopsied. Recommendation:        - Await pathology results.                        - Proceed with colonoscopy as scheduled                        See colonoscopy report Procedure Code(s):     --- Professional ---                        [phone], Esophagogastroduodenoscopy, flexible,                         transoral; with biopsy, single or multiple Diagnosis Code(s):     --- Professional ---                        R19.7, Diarrhea, unspecified                        R63.4, Abnormal weight loss CPT copyright 2019 [employer]. All rights reserved. The codes documented in this report are preliminary and upon coder review may  be revised to meet current compliance requirements. Dr. Ulyess Mort Lin Landsman MD, MD 02/16/2022 11:08:07 AM This report has  been signed electronically. Number of Addenda: 0 Note Initiated On: 02/16/2022 10:44 AM Estimated Blood Loss:  Estimated blood loss: none.      [facility]

## 2022-02-16 NOTE — Transfer of Care (Signed)
Immediate Anesthesia Transfer of Care Note  Patient: Regina Irwin  Procedure(s) Performed: COLONOSCOPY WITH PROPOFOL ESOPHAGOGASTRODUODENOSCOPY (EGD) WITH PROPOFOL  Patient Location: PACU  Anesthesia Type:General  Level of Consciousness: sedated  Airway & Oxygen Therapy: Patient Spontanous Breathing  Post-op Assessment: Report given to RN and Post -op Vital signs reviewed and stable  Post vital signs: Reviewed and stable  Last Vitals:  Vitals Value Taken Time  BP 118/70 02/16/22 1143  Temp 35.6 C 02/16/22 1141  Pulse 47 02/16/22 1143  Resp 14 02/16/22 1143  SpO2 100 % 02/16/22 1143  Vitals shown include unvalidated device data.  Last Pain:  Vitals:   02/16/22 1141  TempSrc: Temporal  PainSc: Asleep         Complications: No notable events documented.

## 2022-02-16 NOTE — Op Note (Signed)
[facility] Patient Name: Loveda Colaizzi Procedure Date: 02/16/2022 10:44 AM MRN: [id number] Account #: [id number] Date of Birth: 04-27-1956 Admit Type: Outpatient Age: 66 Room: [facility] ENDO ROOM 4 Gender: Female Note Status: Finalized Instrument Name: Peds Colonoscope [id number] Procedure:             Colonoscopy Indications:           Last colonoscopy: April 2006, Chronic diarrhea,                         Clinically significant diarrhea of unexplained origin,                         Weight loss Providers:             Lin Landsman MD, MD Medicines:             General Anesthesia Complications:         No immediate complications. Estimated blood loss: None. Procedure:             Pre-Anesthesia Assessment:                        - Prior to the procedure, a History and Physical was                         performed, and patient medications and allergies were                         reviewed. The patient is competent. The risks and                         benefits of the procedure and the sedation options and                         risks were discussed with the patient. All questions                         were answered and informed consent was obtained.                         Patient identification and proposed procedure were                         verified by the physician, the nurse, the                         anesthesiologist, the anesthetist and the technician                         in the pre-procedure area in the procedure room in the                         endoscopy suite. Mental Status Examination: alert and                         oriented. Airway Examination: normal oropharyngeal                         airway and neck mobility. Respiratory  Examination:                         clear to auscultation. CV Examination: normal.                         Prophylactic Antibiotics: The patient does not require                          prophylactic antibiotics. Prior Anticoagulants: The                         patient has taken no previous anticoagulant or                         antiplatelet agents. ASA Grade Assessment: III - A                         patient with severe systemic disease. After reviewing                         the risks and benefits, the patient was deemed in                         satisfactory condition to undergo the procedure. The                         anesthesia plan was to use general anesthesia.                         Immediately prior to administration of medications,                         the patient was re-assessed for adequacy to receive                         sedatives. The heart rate, respiratory rate, oxygen                         saturations, blood pressure, adequacy of pulmonary                         ventilation, and response to care were monitored                         throughout the procedure. The physical status of the                         patient was re-assessed after the procedure.                        After obtaining informed consent, the colonoscope was                         passed under direct vision. Throughout the procedure,                         the patient's blood pressure, pulse, and oxygen  saturations were monitored continuously. The                         Colonoscope was introduced through the anus and                         advanced to the 10 cm into the ileum. The colonoscopy                         was unusually difficult due to a tortuous colon.                         Successful completion of the procedure was aided by                         applying abdominal pressure. The patient tolerated the                         procedure well. The quality of the bowel preparation                         was evaluated using the BBPS [facility] Bowel Preparation                         Scale) with scores of: Right Colon = 3, Transverse                          Colon = 3 and Left Colon = 3 (entire mucosa seen well                         with no residual staining, small fragments of stool or                         opaque liquid). The total BBPS score equals 9. Findings:      The perianal and digital rectal examinations were normal. Pertinent       negatives include normal sphincter tone and no palpable rectal lesions.      The terminal ileum appeared normal.      Two sessile polyps were found in the transverse colon and cecum. The       polyps were 4 to 5 mm in size. These polyps were removed with a cold       snare. Resection and retrieval were complete.      Normal mucosa was found in the entire colon. Biopsies for histology were       taken with a cold forceps from the entire colon for evaluation of       microscopic colitis.      Non-bleeding external hemorrhoids were found during retroflexion. The       hemorrhoids were small. Impression:            - The examined portion of the ileum was normal.                        - Two 4 to 5 mm polyps in the transverse colon and in  the cecum, removed with a cold snare. Resected and                         retrieved.                        - Normal mucosa in the entire examined colon. Biopsied.                        - Non-bleeding external hemorrhoids. Recommendation:        - Repeat colonoscopy in 5 to 7 years for surveillance                         based on pathology results.                        - Await pathology results.                        - Discharge patient to home (with escort).                        - Resume previous diet today. Procedure Code(s):     --- Professional ---                        [phone], Colonoscopy, flexible; with removal of                         tumor(s), polyp(s), or other lesion(s) by snare                         technique                        45380, 33, Colonoscopy, flexible; with biopsy, single                          or multiple Diagnosis Code(s):     --- Professional ---                        K64.4, Residual hemorrhoidal skin tags                        K63.5, Polyp of colon                        K52.9, Noninfective gastroenteritis and colitis,                         unspecified                        R19.7, Diarrhea, unspecified                        R63.4, Abnormal weight loss CPT copyright 2019 American Medical [employer]. All rights reserved. The codes documented in this report are preliminary and upon coder review may  be revised to meet current compliance requirements. Dr. Ulyess Mort Lin Landsman MD, MD 02/16/2022 11:39:12 AM This report has been signed electronically. Number of Addenda: 0 Note Initiated On: 02/16/2022  10:44 AM Scope Withdrawal Time: 0 hours 12 minutes 58 seconds  Total Procedure Duration: 0 hours 25 minutes 43 seconds  Estimated Blood Loss:  Estimated blood loss: none.      [facility]

## 2022-02-16 NOTE — H&P (Signed)
Cephas Darby, MD [address]  [place]  [place], [place] [address]  Main: [phone]  Fax: [phone] Pager: [phone]  Primary Care Physician:  Mylinda Latina, PA-C Primary Gastroenterologist:  Dr. Cephas Darby  Pre-Procedure History & Physical: HPI:  Regina Irwin is a 66 y.o. female is here for an endoscopy and colonoscopy.   Past Medical History:  Diagnosis Date   GERD (gastroesophageal reflux disease)    Thyroid disease     Past Surgical History:  Procedure Laterality Date   OTHER SURGICAL HISTORY Left    bursal sac removed from finger   tumor removed  right foot      Prior to Admission medications   Medication Sig Start Date End Date Taking? Authorizing Provider  levothyroxine (SYNTHROID) 75 MCG tablet TAKE 1 TABLET BY MOUTH EVERY DAY BEFORE BREAKFAST 09/05/21  Yes McDonough, Lauren K, PA-C  alendronate (FOSAMAX) 70 MG tablet Take 1 tablet (70 mg total) by mouth every 7 (seven) days. Take with a full glass of water on an empty stomach. Patient not taking: Reported on 12/29/2021 02/13/21   Mylinda Latina, PA-C  Ca Phosphate-Cholecalciferol 200-200 MG-UNIT CHEW Chew by mouth.    [provider]  Cholecalciferol (VITAMIN D3) 1000 units CAPS Take by mouth. Patient not taking: Reported on 12/29/2021    [provider]  famotidine (PEPCID) 20 MG tablet Take 1 tablet (20 mg total) by mouth 2 (two) times daily. 12/02/21   Lavera Guise, MD  Multiple Vitamin (MULTI-VITAMINS) TABS Take by mouth.    [provider]  omeprazole (PRILOSEC) 40 MG capsule TAKE 1 CAPSULE (40 MG TOTAL) BY MOUTH DAILY. 12/28/21   McDonough, Si Gaul, PA-C    Allergies as of 12/29/2021   (No Known Allergies)    Family History  Problem Relation Age of Onset   Diabetes Father    Lung cancer Father    Drug abuse Paternal Grandmother    Drug abuse Paternal Grandfather    Gallstones Mother    Melanoma Mother    Melanoma Brother    Cancer Maternal  Grandmother     Social History   Socioeconomic History   Marital status: Single    Spouse name: Not on file   Number of children: Not on file   Years of education: Not on file   Highest education level: Not on file  Occupational History   Not on file  Tobacco Use   Smoking status: Never   Smokeless tobacco: Never  Vaping Use   Vaping Use: Never used  Substance and Sexual Activity   Alcohol use: Never   Drug use: Never   Sexual activity: Not on file  Other Topics Concern   Not on file  Social History Narrative   Not on file   Social Determinants of Health   Financial Resource Strain: Not on file  Food Insecurity: Not on file  Transportation Needs: Not on file  Physical Activity: Not on file  Stress: Not on file  Social Connections: Not on file  Intimate Partner Violence: Not on file    Review of Systems: See HPI, otherwise negative ROS  Physical Exam: BP 123/68   Pulse (!) 44   Temp (!) 96.2 F (35.7 C) (Temporal)   Resp 16   Ht '5\' 4"'$  (1.626 m)   Wt 45.4 kg   SpO2 100%   BMI 17.16 kg/m  General:   Alert,  pleasant and cooperative in NAD Head:  Normocephalic and atraumatic.  Neck:  Supple; no masses or thyromegaly. Lungs:  Clear throughout to auscultation.    Heart:  Regular rate and rhythm. Abdomen:  Soft, nontender and nondistended. Normal bowel sounds, without guarding, and without rebound.   Neurologic:  Alert and  oriented x4;  grossly normal neurologically.  Impression/Plan: Regina Irwin is here for an endoscopy and colonoscopy to be performed for chronic diarrhea, unexplained weight loss  Risks, benefits, limitations, and alternatives regarding  endoscopy and colonoscopy have been reviewed with the patient.  Questions have been answered.  All parties agreeable.   Sherri Sear, MD  02/16/2022, 10:02 AM

## 2022-02-17 ENCOUNTER — Encounter: Payer: Self-pay | Admitting: Gastroenterology

## 2022-02-17 LAB — SURGICAL PATHOLOGY

## 2022-02-19 ENCOUNTER — Encounter: Payer: Self-pay | Admitting: Gastroenterology

## 2022-02-24 ENCOUNTER — Telehealth: Payer: Self-pay

## 2022-02-24 NOTE — Telephone Encounter (Signed)
Called and left a message for call back  

## 2022-02-24 NOTE — Telephone Encounter (Signed)
-----   Message from Lin Landsman, MD sent at 02/19/2022  5:01 PM EDT ----- Caryl Pina  Please inform patient that the pathology results from upper endoscopy and colonoscopy came back unremarkable for unexplained weight loss and diarrhea.  Recommend 2 weeks course of Xifaxan 550 mg 3 times daily.  Patient should call our office back after the treatment if she is continuing to have ongoing weight loss  Sherri Sear, MD

## 2022-02-25 MED ORDER — RIFAXIMIN 550 MG PO TABS: 550.0000 mg | ORAL_TABLET | Freq: Three times a day (TID) | ORAL | 0 refills | Status: AC

## 2022-02-25 NOTE — Telephone Encounter (Signed)
Patient left a voicemail on my phone and said she at work till 4 each day and does not get home till after 5. She said to call [phone]. Tried to call patient but they said she is on break will try again later

## 2022-02-25 NOTE — Telephone Encounter (Signed)
Called and left a message for call back  

## 2022-02-25 NOTE — Addendum Note (Signed)
Addended by: Ulyess Blossom L on: 02/25/2022 10:35 AM   Modules accepted: Orders

## 2022-02-25 NOTE — Telephone Encounter (Signed)
Patient called back and verbalized understanding of results. Sent Xifaxan to the pharmacy

## 2022-02-28 ENCOUNTER — Other Ambulatory Visit: Payer: Self-pay | Admitting: Physician Assistant

## 2022-02-28 DIAGNOSIS — E039 Hypothyroidism, unspecified: Secondary | ICD-10-CM

## 2022-03-19 ENCOUNTER — Encounter: Payer: BC Managed Care – PPO | Admitting: Physician Assistant

## 2022-03-25 ENCOUNTER — Telehealth: Payer: Self-pay | Admitting: Physician Assistant

## 2022-03-25 NOTE — Telephone Encounter (Signed)
Left vm to confirm 03/30/22 appointment-Toni

## 2022-03-30 ENCOUNTER — Encounter: Payer: Self-pay | Admitting: Physician Assistant

## 2022-03-30 ENCOUNTER — Ambulatory Visit (INDEPENDENT_AMBULATORY_CARE_PROVIDER_SITE_OTHER): Payer: BC Managed Care – PPO | Admitting: Physician Assistant

## 2022-03-30 VITALS — BP 116/61 | HR 64 | Temp 97.8°F | Resp 16 | Ht 64.0 in | Wt 102.0 lb

## 2022-03-30 DIAGNOSIS — M810 Age-related osteoporosis without current pathological fracture: Secondary | ICD-10-CM | POA: Diagnosis not present

## 2022-03-30 DIAGNOSIS — R5383 Other fatigue: Secondary | ICD-10-CM

## 2022-03-30 DIAGNOSIS — Z0001 Encounter for general adult medical examination with abnormal findings: Secondary | ICD-10-CM | POA: Diagnosis not present

## 2022-03-30 DIAGNOSIS — E782 Mixed hyperlipidemia: Secondary | ICD-10-CM

## 2022-03-30 DIAGNOSIS — R636 Underweight: Secondary | ICD-10-CM | POA: Diagnosis not present

## 2022-03-30 DIAGNOSIS — E559 Vitamin D deficiency, unspecified: Secondary | ICD-10-CM

## 2022-03-30 DIAGNOSIS — E538 Deficiency of other specified B group vitamins: Secondary | ICD-10-CM

## 2022-03-30 DIAGNOSIS — E039 Hypothyroidism, unspecified: Secondary | ICD-10-CM | POA: Diagnosis not present

## 2022-03-30 DIAGNOSIS — K219 Gastro-esophageal reflux disease without esophagitis: Secondary | ICD-10-CM | POA: Diagnosis not present

## 2022-03-30 DIAGNOSIS — Z1231 Encounter for screening mammogram for malignant neoplasm of breast: Secondary | ICD-10-CM

## 2022-03-30 DIAGNOSIS — R300 Dysuria: Secondary | ICD-10-CM

## 2022-03-30 NOTE — Progress Notes (Signed)
[facility] [place],  [address]  Internal MEDICINE  Office Visit Note  Patient Name: Regina Irwin  [id number]  [id number]  Date of Service: 04/03/2022  Chief Complaint  Patient presents with   Annual Exam   Gastroesophageal Reflux     HPI Pt is here for routine health maintenance examination -Still Having loose stools intermittently. GI prescribed Xifaxan and pt reports it may have helped some, but not fully.  -Weight has been stable recently -Still has reflux and abdominal pain.  Did find a polyp and recommended 5 year colonoscopy repeat -does sit up at night to avoid worsening reflux. She admits she is consistent with taking her reflux meds and she worries about side effects and tried to explain benefits vs risks. She is going to follow up with GI for continued symptoms -Due for repeat labs -Will also refer to endocrinology for possible infusion therapy for osteoporosis due to severe reflux that may be worsened by oral bisphosphonates. Thinks reflux may have improved some since stopping fosamax, but unsure -due for mammogram  Current Medication: Outpatient Encounter Medications as of 03/30/2022  Medication Sig Note   Ca Phosphate-Cholecalciferol 200-200 MG-UNIT CHEW Chew by mouth.    famotidine (PEPCID) 20 MG tablet Take 1 tablet (20 mg total) by mouth 2 (two) times daily. 12/29/2021: PRN    levothyroxine (SYNTHROID) 75 MCG tablet TAKE 1 TABLET BY MOUTH EVERY DAY BEFORE BREAKFAST    Multiple Vitamin (MULTI-VITAMINS) TABS Take by mouth.    omeprazole (PRILOSEC) 40 MG capsule TAKE 1 CAPSULE (40 MG TOTAL) BY MOUTH DAILY. 12/29/2021: PRN    No facility-administered encounter medications on file as of 03/30/2022.    Surgical History: Past Surgical History:  Procedure Laterality Date   COLONOSCOPY WITH PROPOFOL N/A 02/16/2022   Procedure: COLONOSCOPY WITH PROPOFOL;  Surgeon: Lin Landsman, MD;  Location: [facility] ENDOSCOPY;  Service:  Gastroenterology;  Laterality: N/A;   ESOPHAGOGASTRODUODENOSCOPY (EGD) WITH PROPOFOL N/A 02/16/2022   Procedure: ESOPHAGOGASTRODUODENOSCOPY (EGD) WITH PROPOFOL;  Surgeon: Lin Landsman, MD;  Location: [facility] ENDOSCOPY;  Service: Gastroenterology;  Laterality: N/A;   OTHER SURGICAL HISTORY Left    bursal sac removed from finger   tumor removed  right foot      Medical History: Past Medical History:  Diagnosis Date   GERD (gastroesophageal reflux disease)    Thyroid disease     Family History: Family History  Problem Relation Age of Onset   Diabetes Father    Lung cancer Father    Drug abuse Paternal Grandmother    Drug abuse Paternal Grandfather    Gallstones Mother    Melanoma Mother    Melanoma Brother    Cancer Maternal Grandmother       Review of Systems  Constitutional:  Negative for appetite change, chills, fatigue and unexpected weight change.  HENT:  Negative for congestion, postnasal drip, rhinorrhea, sneezing and sore throat.   Eyes:  Negative for redness.  Respiratory:  Negative for cough, chest tightness and shortness of breath.   Cardiovascular:  Negative for chest pain and palpitations.  Gastrointestinal:  Positive for abdominal pain and nausea. Negative for constipation, diarrhea and vomiting.       Gas and reflux  Genitourinary:  Negative for dysuria and frequency.  Musculoskeletal:  Positive for back pain. Negative for arthralgias, joint swelling and neck pain.  Skin:  Negative for rash.  Neurological: Negative.  Negative for tremors and numbness.  Hematological:  Negative for adenopathy. Does not bruise/bleed easily.  Psychiatric/Behavioral:  Negative for behavioral problems (Depression), sleep disturbance and suicidal ideas. The patient is nervous/anxious.      Vital Signs: BP 116/61   Pulse 64   Temp 97.8 F (36.6 C)   Resp 16   Ht '5\' 4"'$  (1.626 m)   Wt 102 lb (46.3 kg)   SpO2 97%   BMI 17.51 kg/m    Physical Exam Vitals and nursing  note reviewed.  Constitutional:      Appearance: Normal appearance.  HENT:     Head: Normocephalic and atraumatic.     Nose: Nose normal.     Mouth/Throat:     Mouth: Mucous membranes are moist.     Pharynx: No posterior oropharyngeal erythema.  Eyes:     Extraocular Movements: Extraocular movements intact.     Pupils: Pupils are equal, round, and reactive to light.  Cardiovascular:     Rate and Rhythm: Normal rate and regular rhythm.     Pulses: Normal pulses.     Heart sounds: Normal heart sounds.  Pulmonary:     Effort: Pulmonary effort is normal.     Breath sounds: Normal breath sounds.  Abdominal:     General: Abdomen is flat.     Palpations: Abdomen is soft.     Tenderness: There is no abdominal tenderness.  Musculoskeletal:        General: Normal range of motion.  Skin:    General: Skin is warm and dry.  Neurological:     General: No focal deficit present.     Mental Status: She is alert.  Psychiatric:        Mood and Affect: Mood normal.        Behavior: Behavior normal.      LABS: Recent Results (from the past 2160 hour(s))  GI Profile, Stool, PCR     Status: None   Collection Time: 01/23/22  2:45 PM  Result Value Ref Range   Campylobacter Not Detected Not Detected   C difficile toxin A/B Not Detected Not Detected   Plesiomonas shigelloides Not Detected Not Detected   Salmonella Not Detected Not Detected   Vibrio Not Detected Not Detected   Vibrio cholerae Not Detected Not Detected   Yersinia enterocolitica Not Detected Not Detected   Enteroaggregative E coli Not Detected Not Detected   Enteropathogenic E coli Not Detected Not Detected   Enterotoxigenic E coli Not Detected Not Detected   Shiga-toxin-producing E coli Not Detected Not Detected   E coli W413 Not applicable Not Detected   Shigella/Enteroinvasive E coli Not Detected Not Detected   Cryptosporidium Not Detected Not Detected   Cyclospora cayetanensis Not Detected Not Detected   Entamoeba  histolytica Not Detected Not Detected   Giardia lamblia Not Detected Not Detected   Adenovirus F 40/41 Not Detected Not Detected   Astrovirus Not Detected Not Detected   Norovirus GI/GII Not Detected Not Detected   Rotavirus A Not Detected Not Detected   Sapovirus Not Detected Not Detected  Pancreatic elastase, fecal     Status: None   Collection Time: 01/23/22  2:45 PM  Result Value Ref Range   Pancreatic Elastase, Fecal >500 >200 ug Elast./g    Comment:        Severe Pancreatic Insufficiency:          <100        Moderate Pancreatic Insufficiency:   100 - 200        Normal:                                   >  200   Surgical pathology     Status: None   Collection Time: 02/16/22 10:56 AM  Result Value Ref Range   SURGICAL PATHOLOGY      SURGICAL PATHOLOGY CASE: [id number] PATIENT: Jacalyn Lefevre Surgical Pathology Report     Specimen Submitted: A. Duodenum; cbx B. Stomach; cbx C. GEJ; cbx D. Colon polyp, cecum; cold snare E. Colon, random; cbx F. Colon polyp, transverse; cold snare  Clinical History: Colonoscopy Z12.11, screening EGD dysphagia R13.10 and chronic GERD K21.9.  Diverticulosis, colon polyps    DIAGNOSIS: A. DUODENUM; COLD BIOPSY: - ENTERIC MUCOSA WITH PRESERVED VILLOUS ARCHITECTURE AND NO SIGNIFICANT HISTOPATHOLOGIC CHANGE. - NEGATIVE FOR FEATURES OF CELIAC, DYSPLASIA, AND MALIGNANCY.  B. STOMACH; COLD BIOPSY: - GASTRIC ANTRAL AND OXYNTIC MUCOSA WITH NO SIGNIFICANT HISTOPATHOLOGIC CHANGE. - NEGATIVE FOR H. PYLORI, DYSPLASIA, AND MALIGNANCY.  C. GASTROESOPHAGEAL JUNCTION; COLD BIOPSY: - BENIGN SQUAMOUS MUCOSA WITH MILD ACANTHOSIS AND SPONGIOSIS. - NO INCREASE IN INTRAEPITHELIAL EOSINOPHILS (LESS THAN 2 PER HPF). - NO COLUMNAR MUCOSA PRESENT TO ASSESS FOR INTESTINAL ME TAPLASIA. - NEGATIVE FOR DYSPLASIA AND MALIGNANCY.  D. COLON POLYP, CECUM; COLD SNARE: - POLYPOID FRAGMENT OF BENIGN COLONIC MUCOSA WITH SUPERFICIAL REACTIVE CHANGES AND SMALL  LYMPHOID AGGREGATE. - NEGATIVE FOR DYSPLASIA AND MALIGNANCY.  E. COLON, RANDOM; COLD BIOPSY: - BENIGN COLONIC MUCOSA WITH NO SIGNIFICANT HISTOPATHOLOGIC CHANGE. - NEGATIVE FOR ACTIVE MUCOSAL COLITIS AND FEATURES OF MICROSCOPIC COLITIS. - NEGATIVE FOR DYSPLASIA AND MALIGNANCY.  F. COLON POLYP, TRANSVERSE; COLD SNARE: - TUBULAR ADENOMA. - NEGATIVE FOR HIGH-GRADE DYSPLASIA AND MALIGNANCY.  GROSS DESCRIPTION: A. Labeled: cbx duodenum weight loss and chronic diarrhea Received: Formalin Collection time: 10:56 AM on 02/16/2022 Placed into formalin time: 10:56 AM on 02/16/2022 Tissue fragment(s): Multiple Size: Aggregate, 1.3 x 0.5 x 0.2cm Description: Tan soft tissue fragments Entirely submitted in 1 cassette.  B. Labeled: cbx gastric for weight loss and chronic diarrhea Received: Formalin Co llection time: 10:59 AM on 02/16/2022 Placed into formalin time: 10:59 AM on 02/16/2022 Tissue fragment(s): 4 Size: Aggregate, 0.7 x 0.5 x 0.2 cm Description: Tan soft tissue fragments Entirely submitted in 1 cassette.  C. Labeled: cbx GEJ Received: Formalin Collection time: 11:01 AM on 02/16/2022 Placed into formalin time: 11:01 AM on 02/16/2022 Tissue fragment(s): 1 Size: 0.5 x 0.2 x 0.2 cm Description: Pink-white focally translucent soft tissue fragment Entirely submitted in 1 cassette.  D. Labeled: Cold snare polyp cecum Received: Formalin Collection time: 11:24 AM on 02/16/2022 Placed into formalin time: 11:24 AM on 02/16/2022 Tissue fragment(s): Multiple Size: Aggregate, 1.4 x 0.4 x 0.1 cm Description: Received is a single fragment of tan elongated soft tissue admixed with intestinal debris.  The ratio of soft tissue to intestinal debris is 95: 5.  The soft tissue fragment has a resection margin which is inked black.  This fragment is serially sectioned. Entir ely submitted in 1 cassette.  E. Labeled: cbx random colon Received: Formalin Collection time: 11:25 AM on 02/16/2022 Placed  into formalin time: 11:25 AM on 02/16/2022 Tissue fragment(s): Multiple Size: Aggregate, 2.2 x 0.5 x 0.2 cm Description: Tan soft tissue fragments Entirely submitted in 1 cassette.  F. Labeled: Cold snare polyp transverse colon Received: Formalin Collection time: 11:29 AM on 02/16/2022 Placed into formalin time: 11:29 AM on 02/08/2022 Tissue fragment(s): 2 Size: Each 0.4 cm Description: Tan soft tissue fragments Entirely submitted in 1 cassette.  RB 02/16/2022  Final Diagnosis performed by Allena Napoleon, MD.   Electronically signed 02/17/2022 8:55:33AM The electronic signature indicates that the named Attending  Pathologist has evaluated the specimen Technical component performed at [facility], [address] [place], [place] [address] Lab: [phone] Dir: Rush Farmer, MD, MMM  Professional component performed at [facility], [facility], [place], [place], [place] [address] Lab: [phone] Dir: Kathi Simpers, MD         Assessment/Plan: 1. Encounter for general adult medical examination with abnormal findings CPE performed, routine fasting labs ordered, due for mammogram.  Up-to-date on colonoscopy  2. Gastroesophageal reflux disease without esophagitis Advised to restart omeprazole and famotidine as previously instructed help with side effects of acid reflux.  Patient is also going to follow-up with GI due to ongoing symptoms  3. Acquired hypothyroidism We will update labs and adjust Synthroid accordingly - TSH + free T4  4. Senile osteoporosis Stopped Fosamax due to severe reflux that may be worsened by oral bisphosphonates.  Will refer to endocrinology for possible infusion therapy - Ambulatory referral to Endocrinology  5. Underweight We will continue to work on increasing caloric intake.  Weight is stable from last visit  6. Visit for screening mammogram We will order mammogram  7. Vitamin D deficiency - VITAMIN D 25 Hydroxy (Vit-D  Deficiency, Fractures)  8. Mixed hyperlipidemia - Lipid Panel With LDL/HDL Ratio  9. B12 deficiency - B12 and Folate Panel  10. Other fatigue - CBC w/Diff/Platelet - Comprehensive metabolic panel - TSH + free T4 - Lipid Panel With LDL/HDL Ratio - VITAMIN D 25 Hydroxy (Vit-D Deficiency, Fractures) - B12 and Folate Panel  11. Dysuria - UA/M w/rflx Culture, Routine   General Counseling: Regina Irwin verbalizes understanding of the findings of todays visit and agrees with plan of treatment. I have discussed any further diagnostic evaluation that may be needed or ordered today. We also reviewed her medications today. she has been encouraged to call the office with any questions or concerns that should arise related to todays visit.    Counseling:    Orders Placed This Encounter  Procedures   UA/M w/rflx Culture, Routine   CBC w/Diff/Platelet   Comprehensive metabolic panel   TSH + free T4   Lipid Panel With LDL/HDL Ratio   VITAMIN D 25 Hydroxy (Vit-D Deficiency, Fractures)   B12 and Folate Panel   Ambulatory referral to Endocrinology    No orders of the defined types were placed in this encounter.   This patient was seen by Drema Dallas, PA-C in collaboration with Dr. Clayborn Bigness as a part of collaborative care agreement.  Total time spent:35 Minutes  Time spent includes review of chart, medications, test results, and follow up plan with the patient.     Lavera Guise, MD  Internal Medicine

## 2022-03-31 ENCOUNTER — Telehealth: Payer: Self-pay | Admitting: Physician Assistant

## 2022-03-31 NOTE — Telephone Encounter (Signed)
Awaiting 03/30/22 office notes for Endocrinology referral-Toni

## 2022-04-07 ENCOUNTER — Telehealth: Payer: Self-pay | Admitting: Physician Assistant

## 2022-04-07 NOTE — Telephone Encounter (Signed)
Endocrinology referral faxed to Dr. Morayati ; [phone]-Toni 

## 2022-04-10 ENCOUNTER — Telehealth: Payer: Self-pay | Admitting: Physician Assistant

## 2022-04-10 NOTE — Telephone Encounter (Signed)
Received call from [facility] office. They do not accept patient's insurance. Endocrinology referral sent via [facility] to [facility]

## 2022-04-21 ENCOUNTER — Telehealth: Payer: Self-pay | Admitting: Physician Assistant

## 2022-04-21 NOTE — Telephone Encounter (Signed)
Endocrinology referral faxed to Dr. Nelma Rothman in [place]; [phone] per patient's request-Regina Irwin

## 2022-04-21 NOTE — Telephone Encounter (Signed)
Error

## 2022-04-21 NOTE — Telephone Encounter (Signed)
Spoke with patient. Explained to her again, since Dr. Ronnald Collum does not accept her insurance, her endocrinology referral was sent via [employer] to [facility]. Gave patient their telephone #-Toni

## 2022-04-29 ENCOUNTER — Telehealth: Payer: Self-pay

## 2022-04-30 ENCOUNTER — Other Ambulatory Visit: Payer: Self-pay

## 2022-04-30 NOTE — Telephone Encounter (Signed)
As per dr Humphrey Rolls we can send fosamax 70 mg once a week and also if she develop any reflux she need stop it right a way

## 2022-05-01 ENCOUNTER — Other Ambulatory Visit: Payer: Self-pay

## 2022-05-01 MED ORDER — ALENDRONATE SODIUM 70 MG PO TABS: 70.0000 mg | ORAL_TABLET | ORAL | 3 refills | Status: DC

## 2022-05-01 NOTE — Telephone Encounter (Signed)
Pt advised that we send med

## 2022-05-12 DIAGNOSIS — H40023 Open angle with borderline findings, high risk, bilateral: Secondary | ICD-10-CM | POA: Diagnosis not present

## 2022-05-12 DIAGNOSIS — H2513 Age-related nuclear cataract, bilateral: Secondary | ICD-10-CM | POA: Diagnosis not present

## 2022-05-12 DIAGNOSIS — H02886 Meibomian gland dysfunction of left eye, unspecified eyelid: Secondary | ICD-10-CM | POA: Diagnosis not present

## 2022-05-12 DIAGNOSIS — H02883 Meibomian gland dysfunction of right eye, unspecified eyelid: Secondary | ICD-10-CM | POA: Diagnosis not present

## 2022-05-19 DIAGNOSIS — H5210 Myopia, unspecified eye: Secondary | ICD-10-CM | POA: Diagnosis not present

## 2022-05-19 DIAGNOSIS — H401190 Primary open-angle glaucoma, unspecified eye, stage unspecified: Secondary | ICD-10-CM | POA: Diagnosis not present

## 2022-05-19 DIAGNOSIS — H02885 Meibomian gland dysfunction left lower eyelid: Secondary | ICD-10-CM | POA: Diagnosis not present

## 2022-08-05 DIAGNOSIS — E559 Vitamin D deficiency, unspecified: Secondary | ICD-10-CM | POA: Diagnosis not present

## 2022-08-05 DIAGNOSIS — E538 Deficiency of other specified B group vitamins: Secondary | ICD-10-CM | POA: Diagnosis not present

## 2022-08-05 DIAGNOSIS — E782 Mixed hyperlipidemia: Secondary | ICD-10-CM | POA: Diagnosis not present

## 2022-08-05 DIAGNOSIS — R5383 Other fatigue: Secondary | ICD-10-CM | POA: Diagnosis not present

## 2022-08-05 DIAGNOSIS — E039 Hypothyroidism, unspecified: Secondary | ICD-10-CM | POA: Diagnosis not present

## 2022-08-06 LAB — TSH+FREE T4
Free T4: 1.55 ng/dL (ref 0.82–1.77)
TSH: 0.12 u[IU]/mL — ABNORMAL LOW (ref 0.450–4.500)

## 2022-08-06 LAB — CBC WITH DIFFERENTIAL/PLATELET
Basophils Absolute: 0 10*3/uL (ref 0.0–0.2)
Basos: 1 %
EOS (ABSOLUTE): 0 10*3/uL (ref 0.0–0.4)
Eos: 1 %
Hematocrit: 39.8 % (ref 34.0–46.6)
Hemoglobin: 13.7 g/dL (ref 11.1–15.9)
Immature Grans (Abs): 0 10*3/uL (ref 0.0–0.1)
Immature Granulocytes: 0 %
Lymphocytes Absolute: 1 10*3/uL (ref 0.7–3.1)
Lymphs: 19 %
MCH: 31.6 pg (ref 26.6–33.0)
MCHC: 34.4 g/dL (ref 31.5–35.7)
MCV: 92 fL (ref 79–97)
Monocytes Absolute: 0.4 10*3/uL (ref 0.1–0.9)
Monocytes: 8 %
Neutrophils Absolute: 3.7 10*3/uL (ref 1.4–7.0)
Neutrophils: 71 %
Platelets: 239 10*3/uL (ref 150–450)
RBC: 4.34 x10E6/uL (ref 3.77–5.28)
RDW: 12 % (ref 11.7–15.4)
WBC: 5.2 10*3/uL (ref 3.4–10.8)

## 2022-08-06 LAB — LIPID PANEL WITH LDL/HDL RATIO
Cholesterol, Total: 149 mg/dL (ref 100–199)
HDL: 64 mg/dL (ref >39)
LDL Chol Calc (NIH): 70 mg/dL (ref 0–99)
LDL/HDL Ratio: 1.1 ratio (ref 0.0–3.2)
Triglycerides: 76 mg/dL (ref 0–149)
VLDL Cholesterol Cal: 15 mg/dL (ref 5–40)

## 2022-08-06 LAB — COMPREHENSIVE METABOLIC PANEL
ALT: 18 IU/L (ref 0–32)
AST: 19 IU/L (ref 0–40)
Albumin/Globulin Ratio: 2.4 — ABNORMAL HIGH (ref 1.2–2.2)
Albumin: 4.4 g/dL (ref 3.9–4.9)
Alkaline Phosphatase: 86 IU/L (ref 44–121)
BUN/Creatinine Ratio: 27 (ref 12–28)
BUN: 21 mg/dL (ref 8–27)
Bilirubin Total: 1.1 mg/dL (ref 0.0–1.2)
CO2: 27 mmol/L (ref 20–29)
Calcium: 9.9 mg/dL (ref 8.7–10.3)
Chloride: 101 mmol/L (ref 96–106)
Creatinine, Ser: 0.78 mg/dL (ref 0.57–1.00)
Globulin, Total: 1.8 g/dL (ref 1.5–4.5)
Glucose: 90 mg/dL (ref 70–99)
Potassium: 4.6 mmol/L (ref 3.5–5.2)
Sodium: 141 mmol/L (ref 134–144)
Total Protein: 6.2 g/dL (ref 6.0–8.5)
eGFR: 83 mL/min/{1.73_m2} (ref >59)

## 2022-08-06 LAB — B12 AND FOLATE PANEL
Folate: >20 ng/mL (ref >3.0)
Vitamin B-12: 1265 pg/mL — ABNORMAL HIGH (ref 232–1245)

## 2022-08-06 LAB — VITAMIN D 25 HYDROXY (VIT D DEFICIENCY, FRACTURES): Vit D, 25-Hydroxy: 57.2 ng/mL (ref 30.0–100.0)

## 2022-08-07 ENCOUNTER — Telehealth: Payer: Self-pay | Admitting: Physician Assistant

## 2022-08-07 NOTE — Telephone Encounter (Signed)
Received vm on after-hour line requesting lab results be mailed to her. I will mail on Monday-Toni

## 2022-08-09 ENCOUNTER — Other Ambulatory Visit: Payer: Self-pay | Admitting: Internal Medicine

## 2022-08-12 ENCOUNTER — Telehealth: Payer: Self-pay | Admitting: Physician Assistant

## 2022-08-12 NOTE — Telephone Encounter (Signed)
Recent lab results mailed to patient-Regina Irwin

## 2022-08-21 ENCOUNTER — Other Ambulatory Visit: Payer: Self-pay | Admitting: Physician Assistant

## 2022-08-21 DIAGNOSIS — E039 Hypothyroidism, unspecified: Secondary | ICD-10-CM

## 2022-08-26 DIAGNOSIS — M810 Age-related osteoporosis without current pathological fracture: Secondary | ICD-10-CM | POA: Diagnosis not present

## 2022-08-26 DIAGNOSIS — E0580 Other thyrotoxicosis without thyrotoxic crisis or storm: Secondary | ICD-10-CM | POA: Diagnosis not present

## 2022-09-15 DIAGNOSIS — H40023 Open angle with borderline findings, high risk, bilateral: Secondary | ICD-10-CM | POA: Diagnosis not present

## 2022-10-01 ENCOUNTER — Ambulatory Visit: Payer: BC Managed Care – PPO | Admitting: Physician Assistant

## 2022-10-02 ENCOUNTER — Encounter: Payer: Self-pay | Admitting: Physician Assistant

## 2022-10-02 ENCOUNTER — Ambulatory Visit: Payer: BC Managed Care – PPO | Admitting: Physician Assistant

## 2022-10-02 VITALS — BP 108/67 | HR 51 | Temp 98.0°F | Resp 16 | Ht 64.0 in | Wt 104.8 lb

## 2022-10-02 DIAGNOSIS — E039 Hypothyroidism, unspecified: Secondary | ICD-10-CM

## 2022-10-02 DIAGNOSIS — K219 Gastro-esophageal reflux disease without esophagitis: Secondary | ICD-10-CM

## 2022-10-02 DIAGNOSIS — M810 Age-related osteoporosis without current pathological fracture: Secondary | ICD-10-CM

## 2022-10-02 DIAGNOSIS — R636 Underweight: Secondary | ICD-10-CM

## 2022-10-02 NOTE — Progress Notes (Signed)
[facility] [address] [place], [place] [address]  Internal MEDICINE  Office Visit Note  Patient Name: Regina Irwin  [id number]  [id number]  Date of Service: 10/13/2022  Chief Complaint  Patient presents with   Follow-up   Gastroesophageal Reflux   Headache    Headaches every day around 4:00pm.    HPI Pt is here for routine follow up -Endo did drop her thyroid replacement slightly  -Protein increased based on recommendation by endo for  -peppermint oil on and off, more refined diet now. Buys sprouted whole wheat flour -continues to have some reflux and abdominal bloating/gas. Has previously seen GI but hasn't followed back up recently. Does mention a hx of this awhile back and took cipro once and it cleared it up and went back to normal diet. Wants to hold off for now on retrying this though -wonders if post infectious IBS after bad tofu awhile back -Headaches at 4pm only, this is when she is leaving work and may be due to ending her work day and changing environment/lighting/screens. Also could be due to low blood sugar at the end of the work day and will try eating regularly and drinking plenty of water -would be interested in speaking with nutritionist as she is vegan and is trying to find ways to intake protein without upsetting stomach  Current Medication: Outpatient Encounter Medications as of 10/02/2022  Medication Sig Note   alendronate (FOSAMAX) 70 MG tablet TAKE 1 TABLET (70 MG TOTAL) BY MOUTH ONCE A WEEK. TAKE WITH A FULL GLASS OF WATER ON AN EMPTY STOMACH.    Ca Phosphate-Cholecalciferol 200-200 MG-UNIT CHEW Chew by mouth.    famotidine (PEPCID) 20 MG tablet Take 1 tablet (20 mg total) by mouth 2 (two) times daily. 12/29/2021: PRN    levothyroxine (SYNTHROID) 75 MCG tablet TAKE 1 TABLET BY MOUTH EVERY DAY BEFORE BREAKFAST (Patient taking differently: Take 75 mcg by mouth. 1/2 pill every Sunday, 1 full pill Monday-Saturday.)    Multiple Vitamin  (MULTI-VITAMINS) TABS Take by mouth.    omeprazole (PRILOSEC) 40 MG capsule TAKE 1 CAPSULE (40 MG TOTAL) BY MOUTH DAILY. 12/29/2021: PRN    No facility-administered encounter medications on file as of 10/02/2022.    Surgical History: Past Surgical History:  Procedure Laterality Date   COLONOSCOPY WITH PROPOFOL N/A 02/16/2022   Procedure: COLONOSCOPY WITH PROPOFOL;  Surgeon: Toney Reil, MD;  Location: [facility] ENDOSCOPY;  Service: Gastroenterology;  Laterality: N/A;   ESOPHAGOGASTRODUODENOSCOPY (EGD) WITH PROPOFOL N/A 02/16/2022   Procedure: ESOPHAGOGASTRODUODENOSCOPY (EGD) WITH PROPOFOL;  Surgeon: Toney Reil, MD;  Location: [facility] ENDOSCOPY;  Service: Gastroenterology;  Laterality: N/A;   OTHER SURGICAL HISTORY Left    bursal sac removed from finger   tumor removed  right foot      Medical History: Past Medical History:  Diagnosis Date   GERD (gastroesophageal reflux disease)    Thyroid disease     Family History: Family History  Problem Relation Age of Onset   Diabetes Father    Lung cancer Father    Drug abuse Paternal Grandmother    Drug abuse Paternal Grandfather    Gallstones Mother    Melanoma Mother    Melanoma Brother    Cancer Maternal Grandmother     Social History   Socioeconomic History   Marital status: Single    Spouse name: Not on file   Number of children: Not on file   Years of education: Not on file   Highest education level:  Not on file  Occupational History   Not on file  Tobacco Use   Smoking status: Never   Smokeless tobacco: Never  Vaping Use   Vaping Use: Never used  Substance and Sexual Activity   Alcohol use: Never   Drug use: Never   Sexual activity: Not on file  Other Topics Concern   Not on file  Social History Narrative   Not on file   Social Determinants of Health   Financial Resource Strain: Not on file  Food Insecurity: Not on file  Transportation Needs: Not on file  Physical Activity: Not on file  Stress:  Not on file  Social Connections: Not on file  Intimate Partner Violence: Not on file      Review of Systems  Constitutional:  Negative for appetite change, chills, fatigue and unexpected weight change.  HENT:  Negative for congestion, postnasal drip, rhinorrhea, sneezing and sore throat.   Eyes:  Negative for redness.  Respiratory:  Negative for cough, chest tightness and shortness of breath.   Cardiovascular:  Negative for chest pain and palpitations.  Gastrointestinal:  Negative for constipation, diarrhea and vomiting.       Gas and reflux  Genitourinary:  Negative for dysuria and frequency.  Musculoskeletal:  Negative for arthralgias, joint swelling and neck pain.  Skin:  Negative for rash.  Neurological:  Positive for headaches. Negative for tremors and numbness.  Hematological:  Negative for adenopathy. Does not bruise/bleed easily.  Psychiatric/Behavioral:  Negative for behavioral problems (Depression), sleep disturbance and suicidal ideas.     Vital Signs: BP 108/67   Pulse (!) 51   Temp 98 F (36.7 C)   Resp 16   Ht  (1.626 m)   Wt 104 lb 12.8 oz (47.5 kg)   BMI 17.99 kg/m    Physical Exam Vitals and nursing note reviewed.  Constitutional:      Appearance: Normal appearance.  HENT:     Head: Normocephalic and atraumatic.     Nose: Nose normal.     Mouth/Throat:     Mouth: Mucous membranes are moist.     Pharynx: No posterior oropharyngeal erythema.  Eyes:     Extraocular Movements: Extraocular movements intact.     Pupils: Pupils are equal, round, and reactive to light.  Cardiovascular:     Rate and Rhythm: Normal rate and regular rhythm.     Pulses: Normal pulses.     Heart sounds: Normal heart sounds.  Pulmonary:     Effort: Pulmonary effort is normal.     Breath sounds: Normal breath sounds.  Abdominal:     General: Abdomen is flat.     Palpations: Abdomen is soft.  Musculoskeletal:        General: Normal range of motion.  Skin:    General:  Skin is warm and dry.  Neurological:     General: No focal deficit present.     Mental Status: She is alert.  Psychiatric:        Mood and Affect: Mood normal.        Behavior: Behavior normal.        Assessment/Plan: 1. Underweight Working to increase protein and will refer to nutritionist to help with diet planning - Amb ref to Medical Nutrition Therapy-MNT  2. Gastroesophageal reflux disease without esophagitis Continue current medication  3. Acquired hypothyroidism Recently lowered by endo to 1 tab daily except for 1/2tab one day per week  4. Senile osteoporosis Followed by endo  General Counseling: else habermann understanding of the findings of todays visit and agrees with plan of treatment. I have discussed any further diagnostic evaluation that may be needed or ordered today. We also reviewed her medications today. she has been encouraged to call the office with any questions or concerns that should arise related to todays visit.    Orders Placed This Encounter  Procedures   Amb ref to Medical Nutrition Therapy-MNT    No orders of the defined types were placed in this encounter.   This patient was seen by Lynn Ito, PA-C in collaboration with Dr. Beverely Risen as a part of collaborative care agreement.   Total time spent:30 Minutes Time spent includes review of chart, medications, test results, and follow up plan with the patient.      Dr Lyndon Code Internal medicine

## 2022-11-24 ENCOUNTER — Ambulatory Visit
Admission: RE | Admit: 2022-11-24 | Discharge: 2022-11-24 | Disposition: A | Discharge status: Home / Self Care | Payer: BC Managed Care – PPO | Attending: Nurse Practitioner | Admitting: Nurse Practitioner

## 2022-11-24 ENCOUNTER — Ambulatory Visit
Admission: RE | Admit: 2022-11-24 | Discharge: 2022-11-24 | Disposition: A | Discharge status: Home / Self Care | Payer: BC Managed Care – PPO | Source: Ambulatory Visit | Attending: Nurse Practitioner | Admitting: Nurse Practitioner

## 2022-11-24 ENCOUNTER — Ambulatory Visit: Payer: BC Managed Care – PPO | Admitting: Nurse Practitioner

## 2022-11-24 ENCOUNTER — Encounter: Payer: Self-pay | Admitting: Nurse Practitioner

## 2022-11-24 VITALS — BP 116/68 | HR 60 | Temp 97.8°F | Resp 16 | Ht 64.0 in | Wt 103.0 lb

## 2022-11-24 DIAGNOSIS — M25551 Pain in right hip: Secondary | ICD-10-CM | POA: Insufficient documentation

## 2022-11-24 NOTE — Progress Notes (Signed)
[facility] [address] [place], [place] [address]  Internal MEDICINE  Office Visit Note  Patient Name: Regina Irwin  [id number]  [id number]  Date of Service: 11/24/2022  Chief Complaint  Patient presents with   Acute Visit    Right hip pain    HPI Chesnie presents for a follow-up visit for right hip pain Sharp pain in right hip 2 days ago, usually just comes and goes  Difficulty walking into work due to the length of the walk Has osteoporosis, risk of pathological fracture. Currently taking alendronate No fall or known injury to the hip, back or leg.     Current Medication: Outpatient Encounter Medications as of 11/24/2022  Medication Sig Note   alendronate (FOSAMAX) 70 MG tablet TAKE 1 TABLET (70 MG TOTAL) BY MOUTH ONCE A WEEK. TAKE WITH A FULL GLASS OF WATER ON AN EMPTY STOMACH.    Ca Phosphate-Cholecalciferol 200-200 MG-UNIT CHEW Chew by mouth.    famotidine (PEPCID) 20 MG tablet Take 1 tablet (20 mg total) by mouth 2 (two) times daily. 12/29/2021: PRN    levothyroxine (SYNTHROID) 75 MCG tablet TAKE 1 TABLET BY MOUTH EVERY DAY BEFORE BREAKFAST (Patient taking differently: Take 75 mcg by mouth. 1/2 pill every Sunday, 1 full pill Monday-Saturday.)    Multiple Vitamin (MULTI-VITAMINS) TABS Take by mouth.    omeprazole (PRILOSEC) 40 MG capsule TAKE 1 CAPSULE (40 MG TOTAL) BY MOUTH DAILY. 12/29/2021: PRN    No facility-administered encounter medications on file as of 11/24/2022.    Surgical History: Past Surgical History:  Procedure Laterality Date   COLONOSCOPY WITH PROPOFOL N/A 02/16/2022   Procedure: COLONOSCOPY WITH PROPOFOL;  Surgeon: Toney Reil, MD;  Location: [facility] ENDOSCOPY;  Service: Gastroenterology;  Laterality: N/A;   ESOPHAGOGASTRODUODENOSCOPY (EGD) WITH PROPOFOL N/A 02/16/2022   Procedure: ESOPHAGOGASTRODUODENOSCOPY (EGD) WITH PROPOFOL;  Surgeon: Toney Reil, MD;  Location: [facility] ENDOSCOPY;  Service: Gastroenterology;  Laterality: N/A;    OTHER SURGICAL HISTORY Left    bursal sac removed from finger   tumor removed  right foot      Medical History: Past Medical History:  Diagnosis Date   GERD (gastroesophageal reflux disease)    Thyroid disease     Family History: Family History  Problem Relation Age of Onset   Diabetes Father    Lung cancer Father    Drug abuse Paternal Grandmother    Drug abuse Paternal Grandfather    Gallstones Mother    Melanoma Mother    Melanoma Brother    Cancer Maternal Grandmother     Social History   Socioeconomic History   Marital status: Single    Spouse name: Not on file   Number of children: Not on file   Years of education: Not on file   Highest education level: Not on file  Occupational History   Not on file  Tobacco Use   Smoking status: Never   Smokeless tobacco: Never  Vaping Use   Vaping Use: Never used  Substance and Sexual Activity   Alcohol use: Never   Drug use: Never   Sexual activity: Not on file  Other Topics Concern   Not on file  Social History Narrative   Not on file   Social Determinants of Health   Financial Resource Strain: Not on file  Food Insecurity: Not on file  Transportation Needs: Not on file  Physical Activity: Not on file  Stress: Not on file  Social Connections: Not on file  Intimate Partner Violence:  Not on file      Review of Systems  Constitutional: Negative.  Negative for fatigue.  Respiratory: Negative.  Negative for cough, chest tightness, shortness of breath and wheezing.   Cardiovascular: Negative.  Negative for chest pain and palpitations.  Gastrointestinal: Negative.   Musculoskeletal:  Positive for arthralgias (right hip) and gait problem (due to right hip pain).    Vital Signs: BP 116/68   Pulse 60   Temp 97.8 F (36.6 C)   Resp 16   Ht 5\' 4"  (1.626 m)   Wt 103 lb (46.7 kg)   SpO2 96%   BMI 17.68 kg/m    Physical Exam Vitals reviewed.  Constitutional:      General: She is not in acute  distress.    Appearance: Normal appearance. She is not ill-appearing.  HENT:     Head: Normocephalic and atraumatic.  Eyes:     Pupils: Pupils are equal, round, and reactive to light.  Cardiovascular:     Rate and Rhythm: Normal rate and regular rhythm.  Pulmonary:     Effort: Pulmonary effort is normal. No respiratory distress.  Neurological:     Mental Status: She is alert and oriented to person, place, and time.  Psychiatric:        Mood and Affect: Mood normal.        Behavior: Behavior normal.        Assessment/Plan: 1. Acute right hip pain Declined prescription pain medication when offered. Right hip xray ordered to rule out fracture. Instructed patient to call for a doctor's note for work on Thursday if she feels like she cannot go to work and tolerate the pain. Consider orthopedic referral pending xray results. - DG Hip Unilat W OR W/O Pelvis 2-3 Views Right; Future   General Counseling: Lanora verbalizes understanding of the findings of todays visit and agrees with plan of treatment. I have discussed any further diagnostic evaluation that may be needed or ordered today. We also reviewed her medications today. she has been encouraged to call the office with any questions or concerns that should arise related to todays visit.    Orders Placed This Encounter  Procedures   DG Hip Unilat W OR W/O Pelvis 2-3 Views Right    No orders of the defined types were placed in this encounter.   Return if symptoms worsen or fail to improve.   Total time spent:20 Minutes Time spent includes review of chart, medications, test results, and follow up plan with the patient.   [place] Controlled Substance Database was reviewed by me.  This patient was seen by Sallyanne Kuster, FNP-C in collaboration with Dr. Beverely Risen as a part of collaborative care agreement.   Joshlyn Beadle R. Tedd Sias, MSN, FNP-C Internal medicine

## 2022-12-01 ENCOUNTER — Telehealth: Payer: Self-pay

## 2022-12-01 DIAGNOSIS — M80051A Age-related osteoporosis with current pathological fracture, right femur, initial encounter for fracture: Secondary | ICD-10-CM

## 2022-12-01 DIAGNOSIS — M25551 Pain in right hip: Secondary | ICD-10-CM

## 2022-12-02 ENCOUNTER — Other Ambulatory Visit: Payer: Self-pay | Admitting: Physician Assistant

## 2022-12-02 NOTE — Telephone Encounter (Signed)
Pt advised by Regina Irwin for xray result  and send referral for orthopedic

## 2022-12-04 NOTE — Addendum Note (Signed)
Addended by: Sallyanne Kuster on: 12/04/2022 01:12 PM   Modules accepted: Orders

## 2022-12-07 ENCOUNTER — Telehealth: Payer: Self-pay | Admitting: Physician Assistant

## 2022-12-07 NOTE — Telephone Encounter (Signed)
Orthopedic referral sent via [facility] to Dr. Menz with [facility] 

## 2022-12-09 ENCOUNTER — Telehealth: Payer: Self-pay | Admitting: Nurse Practitioner

## 2022-12-09 NOTE — Telephone Encounter (Signed)
Orthopedic appointment 12/21/2022 @ [facility]

## 2022-12-21 DIAGNOSIS — M25551 Pain in right hip: Secondary | ICD-10-CM | POA: Diagnosis not present

## 2022-12-23 DIAGNOSIS — M810 Age-related osteoporosis without current pathological fracture: Secondary | ICD-10-CM | POA: Diagnosis not present

## 2022-12-23 DIAGNOSIS — E0590 Thyrotoxicosis, unspecified without thyrotoxic crisis or storm: Secondary | ICD-10-CM | POA: Diagnosis not present

## 2022-12-28 ENCOUNTER — Telehealth: Payer: Self-pay | Admitting: Physician Assistant

## 2023-01-04 NOTE — Telephone Encounter (Signed)
Error

## 2023-01-19 DIAGNOSIS — H401131 Primary open-angle glaucoma, bilateral, mild stage: Secondary | ICD-10-CM | POA: Diagnosis not present

## 2023-01-19 DIAGNOSIS — H02885 Meibomian gland dysfunction left lower eyelid: Secondary | ICD-10-CM | POA: Diagnosis not present

## 2023-02-08 DIAGNOSIS — M810 Age-related osteoporosis without current pathological fracture: Secondary | ICD-10-CM | POA: Diagnosis not present

## 2023-02-13 ENCOUNTER — Other Ambulatory Visit: Payer: Self-pay | Admitting: Physician Assistant

## 2023-02-13 DIAGNOSIS — E039 Hypothyroidism, unspecified: Secondary | ICD-10-CM

## 2023-02-16 ENCOUNTER — Other Ambulatory Visit: Payer: Self-pay | Admitting: Internal Medicine

## 2023-02-16 DIAGNOSIS — Z1231 Encounter for screening mammogram for malignant neoplasm of breast: Secondary | ICD-10-CM

## 2023-03-10 ENCOUNTER — Ambulatory Visit
Admission: RE | Admit: 2023-03-10 | Discharge: 2023-03-10 | Disposition: A | Discharge status: Home / Self Care | Payer: BC Managed Care – PPO | Source: Ambulatory Visit | Attending: Internal Medicine | Admitting: Internal Medicine

## 2023-03-10 DIAGNOSIS — Z1231 Encounter for screening mammogram for malignant neoplasm of breast: Secondary | ICD-10-CM | POA: Insufficient documentation

## 2023-03-29 ENCOUNTER — Telehealth: Payer: Self-pay | Admitting: Physician Assistant

## 2023-03-29 NOTE — Telephone Encounter (Signed)
Left vm to confirm 04/05/23 appointment-[facility]

## 2023-04-05 ENCOUNTER — Ambulatory Visit (INDEPENDENT_AMBULATORY_CARE_PROVIDER_SITE_OTHER): Payer: BC Managed Care – PPO | Admitting: Physician Assistant

## 2023-04-05 ENCOUNTER — Encounter: Payer: Self-pay | Admitting: Physician Assistant

## 2023-04-05 VITALS — BP 120/80 | HR 60 | Temp 97.8°F | Resp 16 | Ht 64.0 in | Wt 102.0 lb

## 2023-04-05 DIAGNOSIS — Z0001 Encounter for general adult medical examination with abnormal findings: Secondary | ICD-10-CM

## 2023-04-05 DIAGNOSIS — E039 Hypothyroidism, unspecified: Secondary | ICD-10-CM

## 2023-04-05 DIAGNOSIS — I7300 Raynaud's syndrome without gangrene: Secondary | ICD-10-CM | POA: Diagnosis not present

## 2023-04-05 DIAGNOSIS — R636 Underweight: Secondary | ICD-10-CM | POA: Diagnosis not present

## 2023-04-05 MED ORDER — AMLODIPINE BESYLATE 2.5 MG PO TABS
2.5000 mg | ORAL_TABLET | Freq: Every day | ORAL | 2 refills | Status: DC
Start: 2023-04-05 — End: 2024-03-21

## 2023-04-05 NOTE — Progress Notes (Signed)
[facility] [address] [place], [place] [address]  Internal MEDICINE  Office Visit Note  Patient Name: Regina Irwin  [id number]  [id number]  Date of Service: 04/13/2023  Chief Complaint  Patient presents with   Annual Exam   Hypothyroidism     HPI Pt is here for routine health maintenance examination -Endocrinology wants [place] to recheck thyroid labs. Is taking on 6 days per week, skips Sundays -Is getting Reclast infusion for osteoporosis -purple fingers, works in cold environment at work and when she takes gloves off and uses warm water this improves, but fingers and toes getting cold. Used to have this in toes only, but worsening. Discussed reynauds and trial of low dose calcium channel blocker. Also offered rheumatology, but pt declines referral at this time -never got in with nutritionist, they wont run her insurance before visit to determine coverage. Has increased protein in diet -mammogram done in Sept  Current Medication: Outpatient Encounter Medications as of 04/05/2023  Medication Sig Note   alendronate (FOSAMAX) 70 MG tablet TAKE 1 TABLET BY MOUTH ONCE A WEEK. TAKE WITH A FULL GLASS OF WATER ON AN EMPTY STOMACH.    amLODipine (NORVASC) 2.5 MG tablet Take 1 tablet (2.5 mg total) by mouth daily.    Ca Phosphate-Cholecalciferol 200-200 MG-UNIT CHEW Chew by mouth.    famotidine (PEPCID) 20 MG tablet Take 1 tablet (20 mg total) by mouth 2 (two) times daily. 12/29/2021: PRN    levothyroxine (SYNTHROID) 75 MCG tablet TAKE 1 TABLET BY MOUTH EVERY DAY BEFORE BREAKFAST (Patient taking differently: Skip ever suday)    Multiple Vitamin (MULTI-VITAMINS) TABS Take by mouth.    omeprazole (PRILOSEC) 40 MG capsule TAKE 1 CAPSULE (40 MG TOTAL) BY MOUTH DAILY. 12/29/2021: PRN    No facility-administered encounter medications on file as of 04/05/2023.    Surgical History: Past Surgical History:  Procedure Laterality Date   COLONOSCOPY WITH PROPOFOL N/A  02/16/2022   Procedure: COLONOSCOPY WITH PROPOFOL;  Surgeon: Toney Reil, MD;  Location: [facility] ENDOSCOPY;  Service: Gastroenterology;  Laterality: N/A;   ESOPHAGOGASTRODUODENOSCOPY (EGD) WITH PROPOFOL N/A 02/16/2022   Procedure: ESOPHAGOGASTRODUODENOSCOPY (EGD) WITH PROPOFOL;  Surgeon: Toney Reil, MD;  Location: [facility] ENDOSCOPY;  Service: Gastroenterology;  Laterality: N/A;   OTHER SURGICAL HISTORY Left    bursal sac removed from finger   tumor removed  right foot      Medical History: Past Medical History:  Diagnosis Date   GERD (gastroesophageal reflux disease)    Thyroid disease     Family History: Family History  Problem Relation Age of Onset   Gallstones Mother    Melanoma Mother    Diabetes Father    Lung cancer Father    Cancer Maternal Grandmother    Drug abuse Paternal Grandmother    Drug abuse Paternal Grandfather    Melanoma Brother    Breast cancer Neg Hx       Review of Systems  Constitutional:  Negative for chills, fatigue and unexpected weight change.  HENT:  Positive for postnasal drip. Negative for congestion, rhinorrhea, sneezing and sore throat.   Eyes:  Negative for redness.  Respiratory:  Negative for cough, chest tightness and shortness of breath.   Cardiovascular:  Negative for chest pain and palpitations.  Gastrointestinal:  Negative for abdominal pain, constipation, diarrhea, nausea and vomiting.  Genitourinary:  Negative for dysuria and frequency.  Musculoskeletal:  Negative for arthralgias, back pain, joint swelling and neck pain.  Skin:  Positive for color  change. Negative for rash.       Fingers and toes getting cold and turning blue  Neurological: Negative.  Negative for tremors and numbness.  Hematological:  Negative for adenopathy. Does not bruise/bleed easily.  Psychiatric/Behavioral:  Negative for behavioral problems (Depression), sleep disturbance and suicidal ideas. The patient is not nervous/anxious.      Vital  Signs: BP 120/80   Pulse 60   Temp 97.8 F (36.6 C)   Resp 16   Ht 5\' 4"  (1.626 m)   Wt 102 lb (46.3 kg)   SpO2 98%   BMI 17.51 kg/m    Physical Exam Vitals and nursing note reviewed.  Constitutional:      Appearance: Normal appearance.  HENT:     Head: Normocephalic and atraumatic.     Nose: Nose normal.     Mouth/Throat:     Mouth: Mucous membranes are moist.     Pharynx: No posterior oropharyngeal erythema.  Eyes:     Extraocular Movements: Extraocular movements intact.     Pupils: Pupils are equal, round, and reactive to light.  Cardiovascular:     Rate and Rhythm: Normal rate and regular rhythm.     Pulses: Normal pulses.     Heart sounds: Normal heart sounds.  Pulmonary:     Effort: Pulmonary effort is normal.     Breath sounds: Normal breath sounds.  Abdominal:     General: Abdomen is flat.     Palpations: Abdomen is soft.  Musculoskeletal:        General: Normal range of motion.  Skin:    General: Skin is warm and dry.     Comments: Blue fingers  Neurological:     General: No focal deficit present.     Mental Status: She is alert.  Psychiatric:        Mood and Affect: Mood normal.        Behavior: Behavior normal.      LABS: Recent Results (from the past 2160 hour(s))  TSH + free T4     Status: None   Collection Time: 04/05/23  1:14 PM  Result Value Ref Range   TSH 1.010 0.450 - 4.500 uIU/mL   Free T4 1.13 0.82 - 1.77 ng/dL        Assessment/Plan: 1. Encounter for general adult medical examination with abnormal findings CPE performed, UTD on PHM  2. 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  Dispense: 30 tablet; Refill: 2  3. Acquired hypothyroidism Will check labs - TSH + free T4  4. Underweight Weight overall stable, down 2 lbs in 6months. Continue to increase protein and caloric intake. May try to follow up  with nutritionist office   General Counseling: Vara Guardian understanding of the findings of todays visit and agrees with plan of treatment. I have discussed any further diagnostic evaluation that may be needed or ordered today. We also reviewed her medications today. she has been encouraged to call the office with any questions or concerns that should arise related to todays visit.    Counseling:    Orders Placed This Encounter  Procedures   TSH + free T4    Meds ordered this encounter  Medications   amLODipine (NORVASC) 2.5 MG tablet    Sig: Take 1 tablet (2.5 mg total) by mouth daily.    Dispense:  30 tablet  Refill:  2    This patient was seen by Lynn Ito, PA-C in collaboration with Dr. Beverely Risen as a part of collaborative care agreement.  Total time spent:35 Minutes  Time spent includes review of chart, medications, test results, and follow up plan with the patient.     Lyndon Code, MD  Internal Medicine

## 2023-04-06 ENCOUNTER — Telehealth: Payer: Self-pay

## 2023-04-06 LAB — TSH+FREE T4
Free T4: 1.13 ng/dL (ref 0.82–1.77)
TSH: 1.01 u[IU]/mL (ref 0.450–4.500)

## 2023-04-06 NOTE — Telephone Encounter (Signed)
-----   Message from Carlean Jews sent at 04/06/2023  1:03 PM EDT ----- Please let her know that her thyroid labs have improved and are now normal on current dosing

## 2023-04-06 NOTE — Telephone Encounter (Signed)
Spoke with patient regarding lab results.

## 2023-04-14 ENCOUNTER — Telehealth: Payer: Self-pay | Admitting: Physician Assistant

## 2023-04-20 NOTE — Telephone Encounter (Signed)
error 

## 2023-05-07 ENCOUNTER — Ambulatory Visit: Payer: BC Managed Care – PPO | Admitting: Physician Assistant

## 2023-05-10 ENCOUNTER — Ambulatory Visit: Payer: BC Managed Care – PPO | Admitting: Physician Assistant

## 2023-05-25 DIAGNOSIS — H0100B Unspecified blepharitis left eye, upper and lower eyelids: Secondary | ICD-10-CM | POA: Diagnosis not present

## 2023-05-25 DIAGNOSIS — H2513 Age-related nuclear cataract, bilateral: Secondary | ICD-10-CM | POA: Diagnosis not present

## 2023-05-25 DIAGNOSIS — H0100A Unspecified blepharitis right eye, upper and lower eyelids: Secondary | ICD-10-CM | POA: Diagnosis not present

## 2023-05-25 DIAGNOSIS — H16223 Keratoconjunctivitis sicca, not specified as Sjogren's, bilateral: Secondary | ICD-10-CM | POA: Diagnosis not present

## 2023-05-26 ENCOUNTER — Other Ambulatory Visit: Payer: Self-pay | Admitting: Physician Assistant

## 2023-07-11 IMAGING — RF DG UGI W/ HIGH DENSITY W/O KUB
13 of 14 series · 14 of 24 positions shown · non-contrast
Comparison: CT AP, most recently 06/28/2018 and 03/21/2012.

CLINICAL DATA: Patient complains of chest pain worse if bending
over, concern for severe reflux and patient does complain of some
oropharyngeal dysphagia symptoms.

EXAM:
UPPER GI SERIES WITH HIGH DENSITY WITHOUT KUB
TECHNIQUE: Combined double and single contrast examination was performed using
effervescent crystals, high-density barium and thin liquid barium.
The patient was observed with fluoroscopy swallowing a 13 mm barium
sulphate tablet.
FLUOROSCOPY TIME:  Radiation Exposure Index (as provided by the
fluoroscopic device): 9.40 mGy

[Series 1: cp_standard · 0.18mm/px · 1 of 1 slices shown (1 of 13)]
[im 1/1]
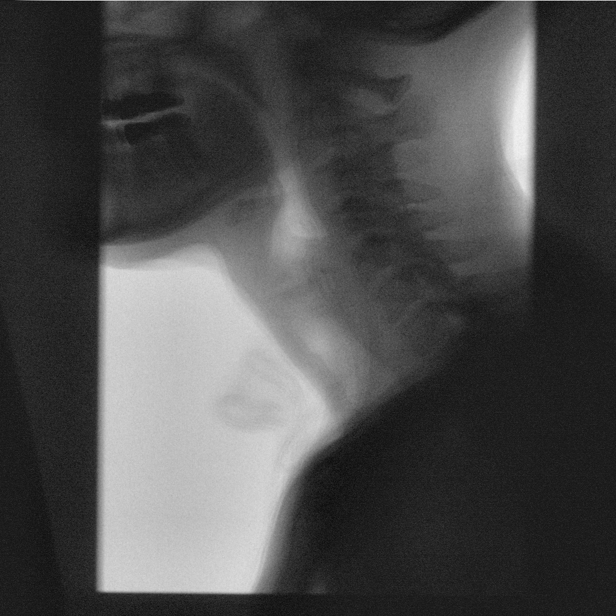

[Series 2: cp_standard · 0.18mm/px · 1 of 58 frames shown (2 of 13)]
[frame 50/58]
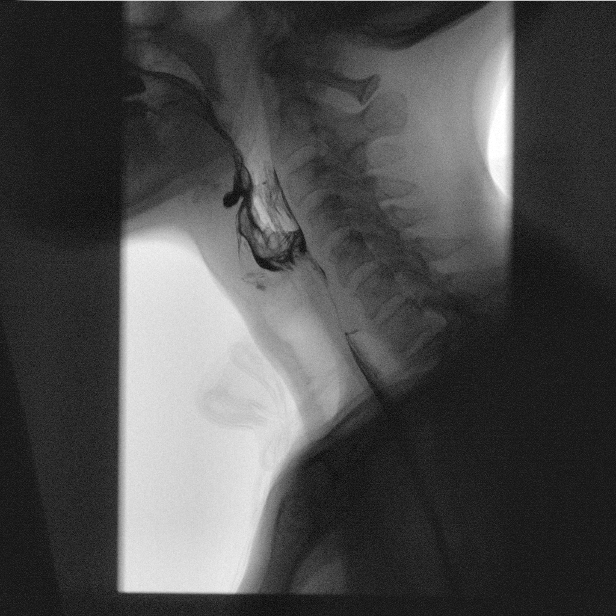

[Series 3: cp_standard · 0.18mm/px · 1 of 53 frames shown (3 of 13)]
[frame 51/53]
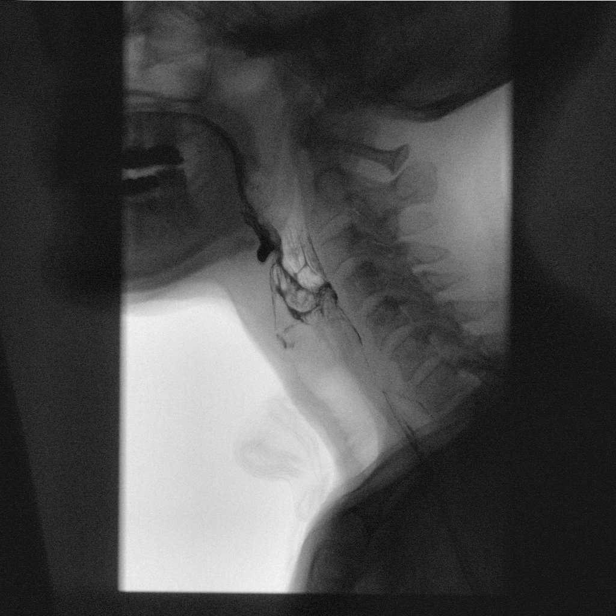

[Series 4: cp_standard · 0.18mm/px · 1 of 55 frames shown (4 of 13)]
[frame 41/55]
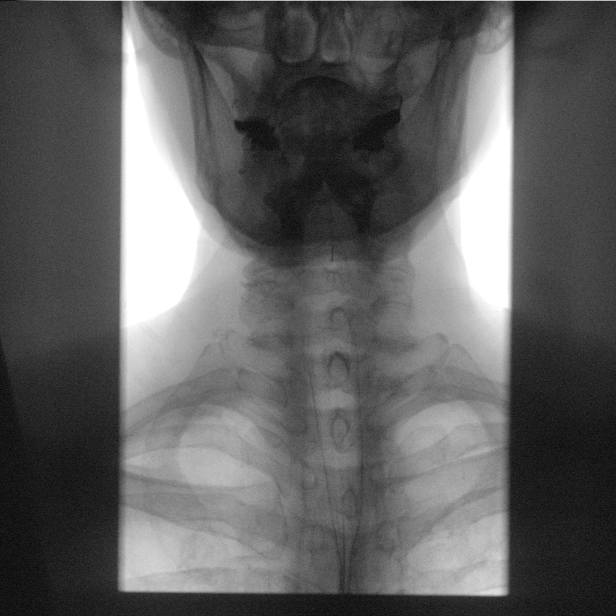

[Series 5: cp_standard · 0.27mm/px · 1 of 155 frames shown (5 of 13)]
[frame 24/155]
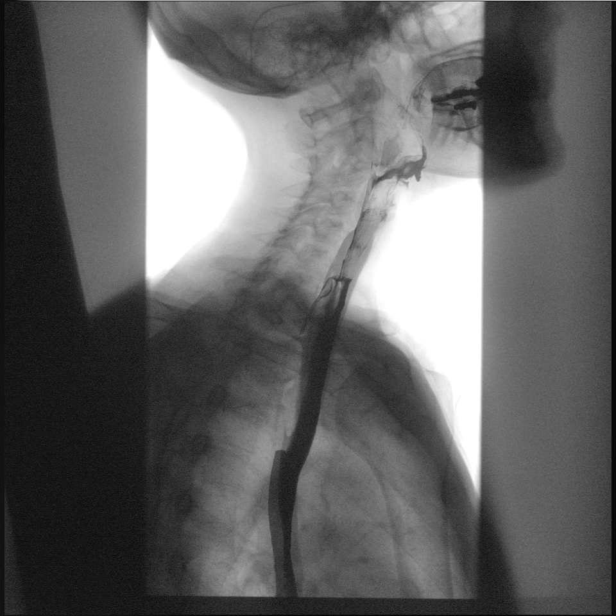

[Series 6: cp_standard · 0.28mm/px · 1 of 159 frames shown (6 of 13)]
[frame 24/159]
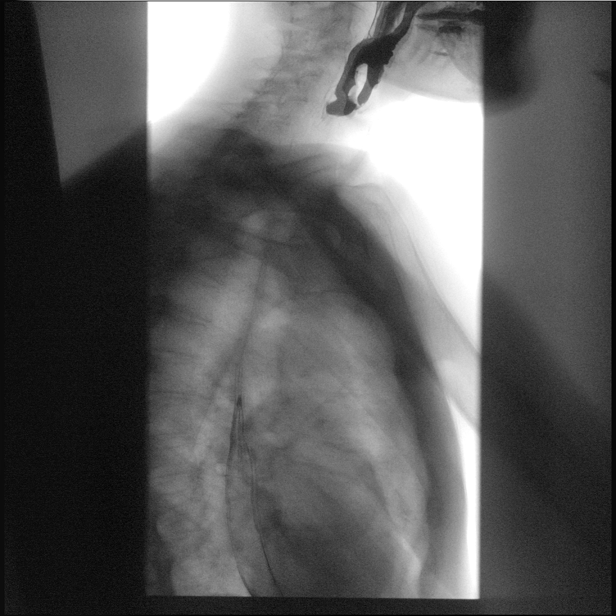

[Series 7: cp_standard · 0.29mm/px · 1 of 1 slices shown (7 of 13)]
[im 1/1]
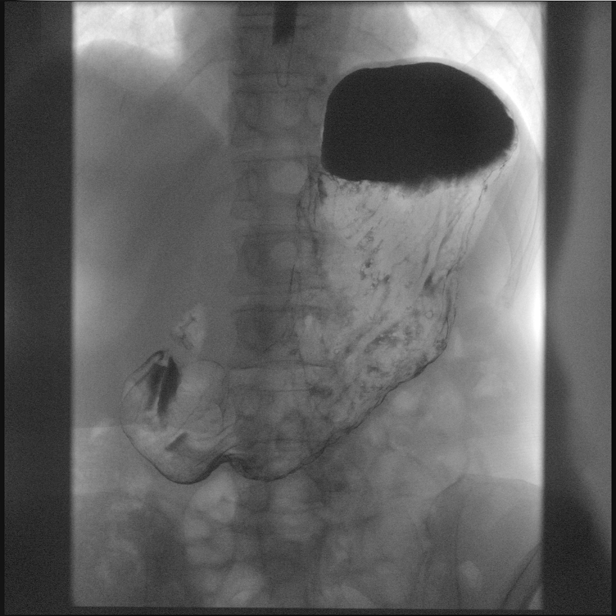

[Series 9: cp_standard · 0.28mm/px · 1 of 1 slices shown (8 of 13)]
[im 1/1]
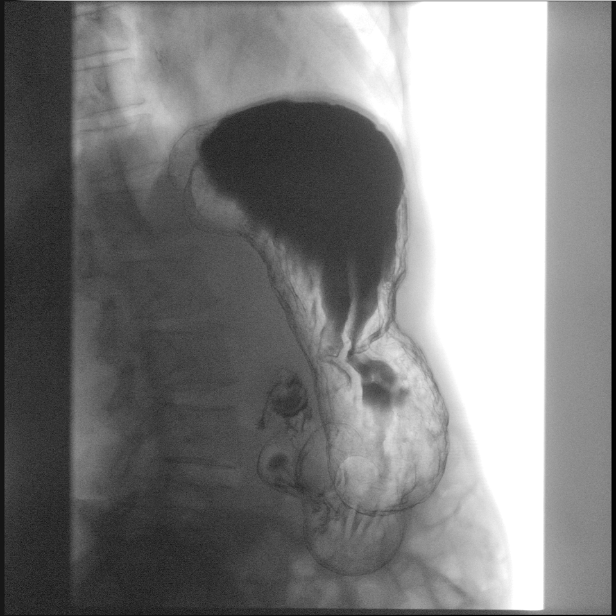

[Series 13: cp_standard · 0.31mm/px · 1 of 1 slices shown (9 of 13)]
[im 1/1]
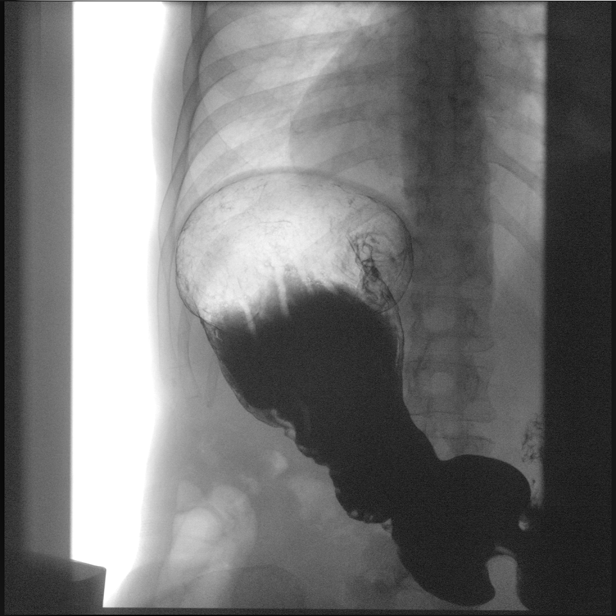

[Series 15: cp_standard · 0.31mm/px · 1 of 109 frames shown (10 of 13)]
[frame 93/109]
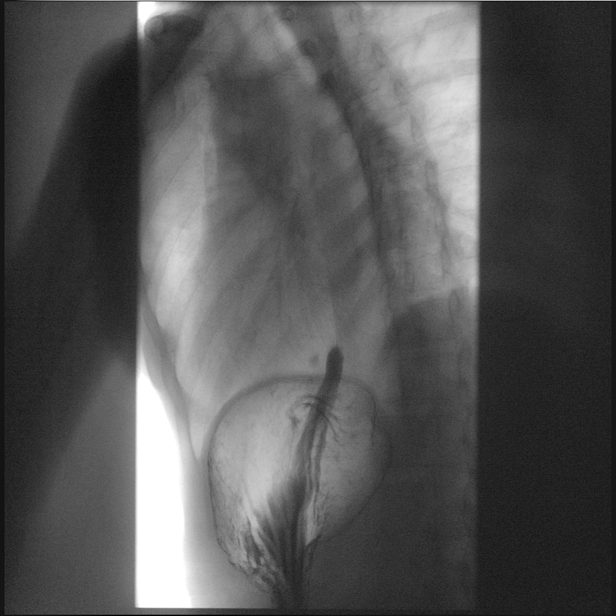

[Series 16: cp_standard · 0.31mm/px · 2 of 183 frames shown (11 of 13)]
[frame 28/183]
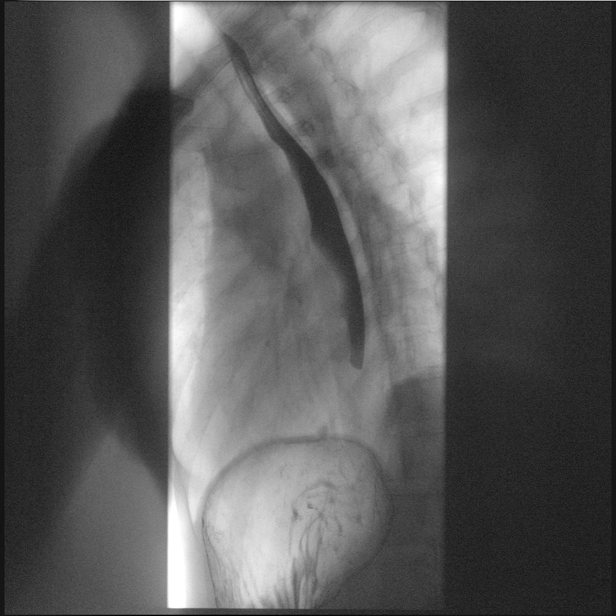
[frame 156/183]
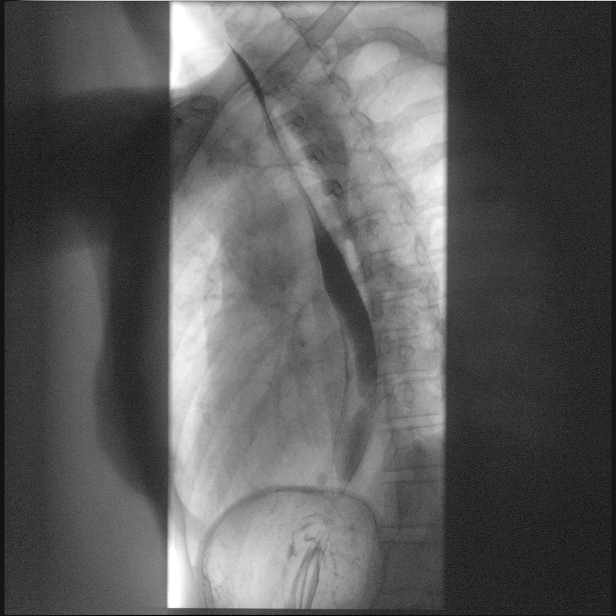

[Series 17: cp_standard · 0.29mm/px · 1 of 141 frames shown (12 of 13)]
[frame 120/141]
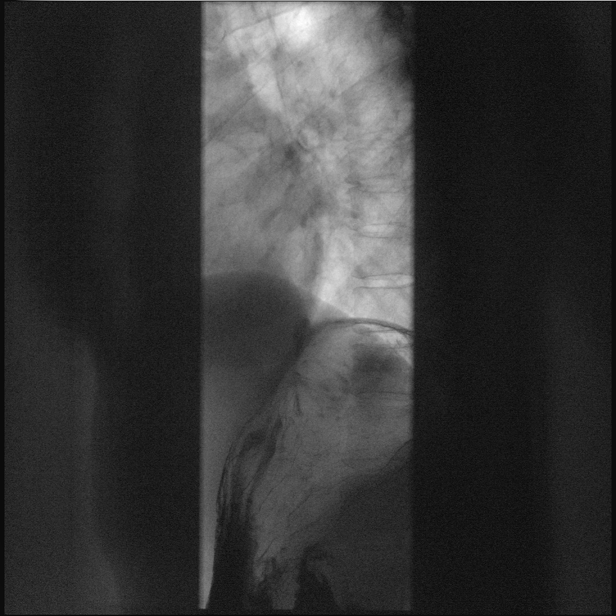

[Series 18: cp_standard · 0.28mm/px · 1 of 47 frames shown (13 of 13)]
[frame 40/47]
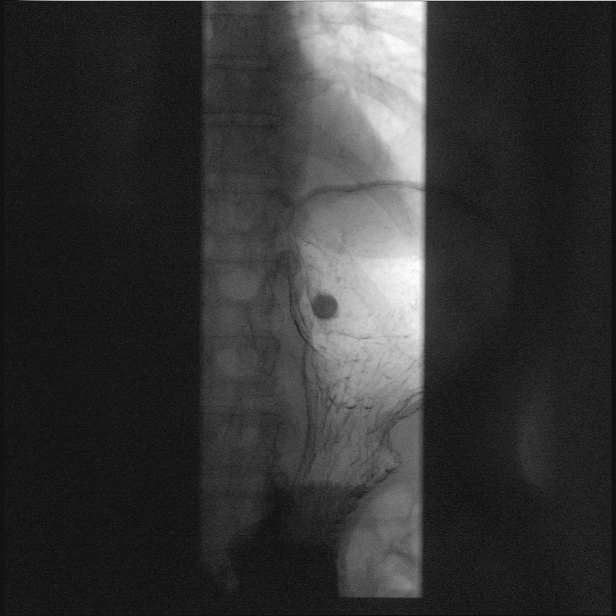

[14 of 24 positions shown; findings below may reference images not displayed]

FINDINGS: UPPER GI SERIES:

Normal pharyngeal anatomy. There is transient laryngeal penetration
which cleared immediately without tracheal aspiration. Examination
of the esophagus demonstrated normal esophageal motility. Normal
esophageal morphology without evidence of esophagitis or ulceration.
No esophageal stricture, diverticula, or mass lesion. No evidence of
hiatal hernia. Mild gastroesophageal reflux extending to the mid
esophagus.

Examination of the stomach demonstrated normal rugal folds and areae
gastricae. Gastric mucosa appeared unremarkable without evidence of
ulceration, scarring, or mass lesion. Gastric motility and emptying
was normal. Fluoroscopic examination of the duodenum demonstrates
normal motility and morphology without evidence of ulceration or
mass lesion. Small round radiodensity immediate to the GE junction
is best appreciated on comparison cross-sectional imaging, and
likely a granuloma.

At the end of the examination a 13 mm barium tablet was administered
which transited through the esophagus and esophagogastric junction
without delay.
IMPRESSION: 1. Laryngeal penetration without tracheal aspiration. Recommend a
formal speech pathology assessment for further evaluation.

2. No esophageal stricture.

3. Mild gastroesophageal reflux up to the mid esophagus.

4. No evidence of hiatal hernia.

This exam was performed by Abimelk Tiger, and was supervised
and interpreted by Dr. Malang.

## 2023-07-27 ENCOUNTER — Telehealth: Payer: Self-pay | Admitting: Physician Assistant

## 2023-07-27 DIAGNOSIS — H2513 Age-related nuclear cataract, bilateral: Secondary | ICD-10-CM | POA: Diagnosis not present

## 2023-07-27 DIAGNOSIS — H401131 Primary open-angle glaucoma, bilateral, mild stage: Secondary | ICD-10-CM | POA: Diagnosis not present

## 2023-07-27 NOTE — Telephone Encounter (Signed)
 Patient called stating she still does not have appointment with [facility]. Per notes from their office, they are not able to get in touch with [employer] rep to verify if they are covered. Patient stated she has letter from [facility] stating they are in network as well as Pamela Ingram and Phillip Aubel, the therapist. I explained this to [facility], she will call patient to schedule appointment-[facility]

## 2023-08-17 ENCOUNTER — Encounter: Payer: Self-pay | Admitting: Dietician

## 2023-08-17 ENCOUNTER — Encounter: Payer: BC Managed Care – PPO | Attending: Physician Assistant | Admitting: Dietician

## 2023-08-17 VITALS — Ht 64.0 in | Wt 101.8 lb

## 2023-08-17 DIAGNOSIS — R636 Underweight: Secondary | ICD-10-CM

## 2023-08-17 NOTE — Progress Notes (Signed)
 Medical Nutrition Therapy  Appointment Start time:  65  Appointment End time:  1245  Primary concerns today: Being healthy/strength  Referral diagnosis: R63.6 - Underweight Preferred learning style: No preference indicated Learning readiness: Contemplating   NUTRITION ASSESSMENT   Anthropometrics Ht: 64" Wt: 101.8 lbs BMI: 17.47 kg/m2 Goal Weight: 120-125 lbs.   Clinical Medical Hx: GERD, Dysphagia, Colitis, Hypoglycemia, Weight Loss, Hypothyroidism, Neoplasm of liver Medications: Levothyroxine Labs: TSH - 1.010 (in range), T4 - 1.13 (in range) Notable Signs/Symptoms: Frail   Lifestyle & Dietary Hx Pt states they would like to be healthy and improve strength/energy.  Pt reports they have had lower energy since decreased dose of levothyroxine, states they were concerned about bone loss with the higher dose. Pt reports being a [employer], and following a vegan diet for the last 10 years. Reports having a good appetite, likes to eat, states they try to avoid "overdoing" it, or eating too much. Pt reports not eating a third meal in the evening out of concern for triggering GERD, previously on PPI but has d/c all reflux meds. Pt reports having adverse reactions to soy, gets protein from many other sources (Almond flour, mixed nuts, Chickpea flour, Legumes, seeds). Pt reports very busy schedule that includes having 12 cats, commuting to [facility] for work, sedentary at work, has to stop at [employer] on the way home, cooks, usually in bed by 8:30 - 10:00 PM. Pt reports only having 30 minutes for lunch at work, and cannot eat or drink at any other time. Pt reports high stress in their daily life, goes to church and observes the Sabbath every week to relax, plays with cats to destress, enjoys being outdoors when the weather warms up.   Estimated daily fluid intake: 48 oz Supplements: Caltrate Bone health gummies, Centrum MVI Sleep: Poor, trying to sleep more Stress /  self-care: High, Work related Current average weekly physical activity: ADLs   24-Hr Dietary Recall First Meal (5:30 AM): Oatmeal/chickpea flour muffin, Mixed nuts, blueberries, Pea/lentil/rice/Butternut squash, on pita w/ peaches  Snack:  Second Meal (12:30 - 1:30 PM): Babganoush (made w/ almond butter) on spaghetti squash Snack:  Third Meal: None Snack:  Beverages: Water, almond milk     NUTRITION DIAGNOSIS  [place]-3.1 Underweight As related to insufficient energy intake.  As evidenced by BMI of 17.47 kg/m2, Low calorie dietary composition, skipping third meal each day, consistently high stress.   NUTRITION INTERVENTION  Nutrition education (E-1) on the following topics:  Educated patient on the role of increasing calories and protein on building muscle mass and healthy weight gain. Worked with patient to identify sources of protein and healthy fats while following a vegan diet, as well as how to increase calories when preparing food. Educated patient to eat 3 times a day to maximize protein intake.    Handouts Provided Include  AVS  Learning Style & Readiness for Change Teaching method utilized: Visual & Auditory  Demonstrated degree of understanding via: Teach Back  Barriers to learning/adherence to lifestyle change: Schedule, Stress, GERD  Goals Established by Pt Consider adding Ripple plant based milk or Silk Almond & Cashew Protein milk into your diet for extra protein. Try slowly drinking a serving in the evening before bed, as tolerated. Try a few high protein food and/or beverage options in the evening around 5:00 - 6:00 PM. Give yourself at least 2 hours before bed to reduce the risk of reflux.   MONITORING & EVALUATION Dietary intake, weekly physical activity,  and weight gain in 2 months.  Next Steps  Patient is to add meal or snack in evening, follow up with RD.

## 2023-08-17 NOTE — Patient Instructions (Addendum)
 Consider adding Ripple plant based milk or Silk Almond & Cashew Protein milk into your diet for extra protein. Try slowly drinking a serving in the evening before bed, as tolerated.  Try a few high protein food and/or beverage options in the evening around 5:00 - 6:00 PM. Give yourself at least 2 hours before bed to reduce the risk of reflux.

## 2023-08-21 ENCOUNTER — Other Ambulatory Visit: Payer: Self-pay | Admitting: Physician Assistant

## 2023-08-21 DIAGNOSIS — E039 Hypothyroidism, unspecified: Secondary | ICD-10-CM

## 2023-11-12 ENCOUNTER — Encounter: Attending: Physician Assistant | Admitting: Dietician

## 2023-11-12 ENCOUNTER — Encounter: Payer: Self-pay | Admitting: Dietician

## 2023-11-12 VITALS — Ht 64.0 in | Wt 102.0 lb

## 2023-11-12 DIAGNOSIS — R636 Underweight: Secondary | ICD-10-CM

## 2023-11-12 NOTE — Patient Instructions (Signed)
 Prioritize adding more healthy fats to your diet with nuts, nut butters, seeds, oils, avocado, etc. Try having these paired with whole fruit, or whole grains.  Please consider taking your antacid daily!! This will help to reduce reflux overall and allow for an increase in intake comfortably.

## 2023-11-12 NOTE — Progress Notes (Signed)
 Medical Nutrition Therapy  Appointment Start time:  [phone]  Appointment End time:  [phone]  Primary concerns today: Being healthy/strength  Referral diagnosis: R63.6 - Underweight Preferred learning style: No preference indicated Learning readiness: Not ready   NUTRITION ASSESSMENT   Anthropometrics Ht: 64" Wt: 102 lbs BMI: 17.51 kg/m2 Weight Change: +0.2 lbs in 2 months Goal Weight: 120-125 lbs.   Clinical Medical Hx: GERD, Dysphagia, Colitis, Hypoglycemia, Weight Loss, Hypothyroidism, Neoplasm of liver Medications: Levothyroxine  Labs: TSH - 1.010 (in range), T4 - 1.13 (in range) Notable Signs/Symptoms: Frail   Lifestyle & Dietary Hx Pt reports lowering Levothyroxine  from 75 mcg 7 days a week to 6 days a week. Pt reports feeling like they have more muscle mass, but lower energy since change in medication. Pt reports lingering reflux, states they haven't seen any improvement, not using any antacid medications. Pt reports adding in a cup of almond/soy protein milk in the evening, adding in high protein pasta, and eating lentils regularly to increase protein intake. Pt reports juicing vegetables now, using these juices to cook with. Pt reports continuing stress at work, can't take breaks to eat or drink during the day. Pt states they have no set date for retirement, has to continue working in high stress environment indefinitely.   Estimated daily fluid intake: 48 oz Supplements: Caltrate Bone health gummies, Centrum MVI Sleep: Remains poor Stress / self-care: High, Work related Current average weekly physical activity: ADLs   24-Hr Dietary Recall First Meal: Steel cut oat/barley/amaranth/barley/red lentils, with mulberries Snack: Palmful of nuts Second Meal: Mixed vegetables in farina w/ pinto beans, romaine salad w/ pumpkin seeds Snack: Almond flour, grape nuts, tart cherry, cheerios Third Meal: Green pea hummus Snack:  Beverages: Water, almond milk     NUTRITION  DIAGNOSIS  [place]-3.1 Underweight As related to insufficient energy intake.  As evidenced by BMI of 17.47 kg/m2, Low calorie dietary composition, skipping third meal each day, consistently high stress.   NUTRITION INTERVENTION  Nutrition education (E-1) on the following topics:  Educated patient on the role of increasing calories and protein on building muscle mass and healthy weight gain. Worked with patient to identify sources of protein and healthy fats while following a vegan diet, as well as how to increase calories when preparing food. Educated patient to eat 3 times a day to maximize protein intake.  NEW: Educated pt on increasing calories using healthy fats. Educated pt on potential increase in reflux associated with higher fat intake.    Handouts Provided Include  AVS  Learning Style & Readiness for Change Teaching method utilized: Visual & Auditory  Demonstrated degree of understanding via: Teach Back  Barriers to learning/adherence to lifestyle change: Schedule, Stress, GERD, Resistance to change  Goals Established by Pt Prioritize adding more healthy fats to your diet with nuts, nut butters, seeds, oils, avocado, etc. Try having these paired with whole fruit, or whole grains. Please consider taking your antacid daily!! This will help to reduce reflux overall and allow for an increase in intake comfortably.   MONITORING & EVALUATION Dietary intake, weekly physical activity, and weight gain in 2 months.  Next Steps  Patient is to add meal or snack in evening, follow up with RD.

## 2023-11-30 DIAGNOSIS — H401131 Primary open-angle glaucoma, bilateral, mild stage: Secondary | ICD-10-CM | POA: Diagnosis not present

## 2023-12-01 DIAGNOSIS — E0590 Thyrotoxicosis, unspecified without thyrotoxic crisis or storm: Secondary | ICD-10-CM | POA: Diagnosis not present

## 2023-12-01 DIAGNOSIS — M810 Age-related osteoporosis without current pathological fracture: Secondary | ICD-10-CM | POA: Diagnosis not present

## 2023-12-01 DIAGNOSIS — R5383 Other fatigue: Secondary | ICD-10-CM | POA: Diagnosis not present

## 2024-02-04 ENCOUNTER — Encounter: Attending: Physician Assistant | Admitting: Dietician

## 2024-02-04 ENCOUNTER — Encounter: Payer: Self-pay | Admitting: Dietician

## 2024-02-04 VITALS — Ht 64.0 in | Wt 102.2 lb

## 2024-02-04 DIAGNOSIS — R636 Underweight: Secondary | ICD-10-CM | POA: Diagnosis not present

## 2024-02-04 NOTE — Patient Instructions (Addendum)
 Try to drink 64 oz. or 2L of plain water each day  Choose lower potassium options for your recipes and meals. Use your Potassium Food list to identify higher potassium foods in your diet and choose lower potassium substitutions.  Have a small portion of protein pasta, quinoa, or oatmeal in the evening 2-3 hours before laying down.  Add a pinch of salt to your meals.

## 2024-02-04 NOTE — Progress Notes (Signed)
 Medical Nutrition Therapy  Appointment Start time:  1030  Appointment End time:  1115  Primary concerns today: Being healthy/strength  Referral diagnosis: R63.6 - Underweight Preferred learning style: No preference indicated Learning readiness: Not ready   NUTRITION ASSESSMENT   Anthropometrics Ht: 64 Wt: 102.2 lbs BMI: 17.54 kg/m2 Weight Change: +0.4 lbs in 6 months Goal Weight: 120-125 lbs.   Clinical Medical Hx: GERD, Dysphagia, Colitis, Hypoglycemia, Weight Loss, Hypothyroidism, Neoplasm of liver Medications: Levothyroxine  Labs: (New 12/01/2023) TSH - 0.508 (low), T4 - 1.15 (in range), Potassium - 5.4 (high), CO2 - 32.9 (high), BUN - 26 (high) Notable Signs/Symptoms: Frail   Lifestyle & Dietary Hx Pt reports continuing difficulty making changes due to high demand at work, states they don't have the time. Pt reports increased fatigue since lowering dose of thyroid  medication. Pt reports concern over recent labs, elevated potassium. Pt states they have been trying to gain weight, unsure why they havent, trying to add more plant based fats into diet.   Estimated daily fluid intake: 36 oz Supplements: Caltrate Bone health gummies, Centrum MVI Sleep: Remains poor Stress / self-care: High, Work related Current average weekly physical activity: ADLs   24-Hr Dietary Recall First Meal: Barley, sweet potato/red lentil pasta, Grape nuts, cherry juice, apple sauce, sweet tamarind, blueberries Snack:  Second Meal: Broccoli, sweet potato/red lentil pasta, cucumber, bean sauce, a couple PB crackers Snack:  Third Meal: 1 cup soy/almond milk Snack:  Beverages: Water, almond milk     NUTRITION DIAGNOSIS  [place]-3.1 Underweight As related to insufficient energy intake.  As evidenced by BMI of 17.47 kg/m2, Low calorie dietary composition, skipping third meal each day, consistently high stress.   NUTRITION INTERVENTION  Nutrition education (E-1) on the following topics:  Educated  patient on the role of increasing calories and protein on building muscle mass and healthy weight gain. Worked with patient to identify sources of protein and healthy fats while following a vegan diet, as well as how to increase calories when preparing food. Educated patient to eat 3 times a day to maximize protein intake.  Educated pt on increasing calories using healthy fats. Educated pt on potential increase in reflux associated with higher fat intake.    Handouts Provided Include  AVS NEW: Potassium Food List  Learning Style & Readiness for Change Teaching method utilized: Visual & Auditory  Demonstrated degree of understanding via: Teach Back  Barriers to learning/adherence to lifestyle change: Schedule, Stress, GERD, Resistance to change   Goals Established by Pt Try to drink 64 oz. or 2L of plain water each day Choose lower potassium options for your recipes and meals. Use your Potassium Food list to identify higher potassium foods in your diet and choose lower potassium substitutions. Have a small portion of protein pasta, quinoa, or oatmeal in the evening 2-3 hours before laying down. Add a pinch of salt to your meals.   MONITORING & EVALUATION Dietary intake, weekly physical activity, and weight gain in 3 months.  Next Steps  Patient is to add meal or snack in evening, follow up with RD.

## 2024-02-14 ENCOUNTER — Ambulatory Visit (INDEPENDENT_AMBULATORY_CARE_PROVIDER_SITE_OTHER): Admitting: Physician Assistant

## 2024-02-14 ENCOUNTER — Telehealth: Payer: Self-pay

## 2024-02-14 ENCOUNTER — Encounter: Payer: Self-pay | Admitting: Physician Assistant

## 2024-02-14 VITALS — BP 110/65 | HR 51 | Temp 98.1°F | Resp 16 | Ht 64.0 in | Wt 104.0 lb

## 2024-02-14 DIAGNOSIS — E875 Hyperkalemia: Secondary | ICD-10-CM

## 2024-02-14 DIAGNOSIS — R001 Bradycardia, unspecified: Secondary | ICD-10-CM

## 2024-02-14 DIAGNOSIS — Z833 Family history of diabetes mellitus: Secondary | ICD-10-CM

## 2024-02-14 DIAGNOSIS — R5383 Other fatigue: Secondary | ICD-10-CM | POA: Diagnosis not present

## 2024-02-14 DIAGNOSIS — E782 Mixed hyperlipidemia: Secondary | ICD-10-CM

## 2024-02-14 DIAGNOSIS — M810 Age-related osteoporosis without current pathological fracture: Secondary | ICD-10-CM

## 2024-02-14 DIAGNOSIS — E559 Vitamin D deficiency, unspecified: Secondary | ICD-10-CM

## 2024-02-14 NOTE — Progress Notes (Signed)
 [facility] [address] [place], [place] [address]  Internal MEDICINE  Office Visit Note  Patient Name: Regina Irwin  [id number]  [id number]  Date of Service: 02/14/2024  Chief Complaint  Patient presents with   Acute Visit   Gastroesophageal Reflux     HPI Pt is here for a sick visit. -endo did recent labs prior to reclast  infusion and it showed high potassium and needs this rechecked along with creatinine before infusion may be rescheduled -dietician saw high potassium and already advised on low potassium foods. Also working to increase intake to help gain weight -nerve pain in right leg, thinks related to hip. Varies in location. Saw ortho last year. Short spurts. Does not want to take NSAIDs or any meds. Declines further imaging or eval at this time. 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 Will try to alternate sitting/standing more frequently at her work station and walking as able to help with mobility and avoid stiffness -other concern that prevented reclast  infusion was that they found her HR to be stable at 45 and provider on call opted to hold on this until she was checked. States her normal endocrinologist who knows her was out of town at the time. She has had stable asymptomatic bradycardia for years. Last EKG in 2023 showed bradycardia at 51. Hr in office today again 2 -states she feels good if she is moving, not fatigued. States rarely dizzy and seems to happen only if she has been sitting a long time.  -Discussed cardiology eval but she wants to hold off since it has been stable. Open to starting with zio monitor to better identify HR fluctuations and consider cardiology pending results. -will go ahead and check other labs for CPE in a few months  Current Medication:  Outpatient Encounter Medications as of 02/14/2024  Medication Sig Note   alendronate  (FOSAMAX ) 70 MG tablet TAKE 1 TABLET BY MOUTH ONCE A WEEK. TAKE WITH A  FULL GLASS OF WATER ON AN EMPTY STOMACH.    amLODipine  (NORVASC ) 2.5 MG tablet Take 1 tablet (2.5 mg total) by mouth daily.    Ca Phosphate-Cholecalciferol 200-200 MG-UNIT CHEW Chew by mouth.    famotidine  (PEPCID ) 20 MG tablet Take 1 tablet (20 mg total) by mouth 2 (two) times daily. 12/29/2021: PRN    levothyroxine  (SYNTHROID ) 75 MCG tablet TAKE 1 TABLET BY MOUTH EVERY DAY BEFORE BREAKFAST    Multiple Vitamin (MULTI-VITAMINS) TABS Take by mouth.    omeprazole  (PRILOSEC) 40 MG capsule TAKE 1 CAPSULE (40 MG TOTAL) BY MOUTH DAILY. 12/29/2021: PRN    No facility-administered encounter medications on file as of 02/14/2024.      Medical History: Past Medical History:  Diagnosis Date   GERD (gastroesophageal reflux disease)    Thyroid  disease      Vital Signs: BP 110/65   Pulse (!) 51   Temp 98.1 F (36.7 C)   Resp 16   Ht 5' 4 (1.626 m)   Wt 104 lb (47.2 kg)   SpO2 99%   BMI 17.85 kg/m    Review of Systems  Constitutional:  Negative for fatigue and fever.  HENT:  Negative for congestion, mouth sores and postnasal drip.   Respiratory:  Negative for cough and shortness of breath.   Cardiovascular:  Negative for chest pain.  Genitourinary:  Negative for flank pain.  Musculoskeletal:  Positive for arthralgias.  Skin:  Negative for rash.  Neurological:  Negative for  dizziness, weakness and numbness.  Psychiatric/Behavioral: Negative.      Physical Exam Vitals reviewed.  Constitutional:      General: She is not in acute distress.    Appearance: Normal appearance. She is not ill-appearing.  HENT:     Head: Normocephalic and atraumatic.  Eyes:     Pupils: Pupils are equal, round, and reactive to light.  Cardiovascular:     Rate and Rhythm: Normal rate and regular rhythm.  Pulmonary:     Effort: Pulmonary effort is normal. No respiratory distress.  Skin:    General: Skin is warm and dry.  Neurological:     Mental Status: She is alert and oriented to person, place, and  time.  Psychiatric:        Mood and Affect: Mood normal.        Behavior: Behavior normal.       Assessment/Plan: 1. Bradycardia (Primary) Stable and asymptomatic in office, however was told it was lower at 45 when she was supposed to have Reclast  infusion. Will old long term monitor to evaluate further and may consider cardiology referral - LONG TERM MONITOR XT (3-14 DAYS); Future - LONG TERM MONITOR XT (3-14 DAYS)  2. Serum potassium elevated Will update labs - Comprehensive metabolic panel with GFR  3. Senile osteoporosis Followed by endo and will update labs and check heart monitor due to abnormal findings when reclast  infusion was scheduled  4. Mixed hyperlipidemia - Lipid Panel With LDL/HDL Ratio  5. Vitamin D  deficiency - VITAMIN D  25 Hydroxy (Vit-D Deficiency, Fractures)  6. Family history of diabetes mellitus - Hgb A1C w/o eAG  7. Other fatigue - Lipid Panel With LDL/HDL Ratio - Comprehensive metabolic panel with GFR - Hgb J8R w/o eAG - VITAMIN D  25 Hydroxy (Vit-D Deficiency, Fractures)   General Counseling: Regina Irwin verbalizes understanding of the findings of todays visit and agrees with plan of treatment. I have discussed any further diagnostic evaluation that may be needed or ordered today. We also reviewed her medications today. she has been encouraged to call the office with any questions or concerns that should arise related to todays visit.    Counseling:    No orders of the defined types were placed in this encounter.   No orders of the defined types were placed in this encounter.   Time spent:30 Minutes

## 2024-02-14 NOTE — Telephone Encounter (Signed)
 Place order for Zio  and also send message to brian from [employer]

## 2024-02-15 ENCOUNTER — Ambulatory Visit: Payer: Self-pay | Admitting: Physician Assistant

## 2024-02-15 LAB — LIPID PANEL WITH LDL/HDL RATIO
Cholesterol, Total: 160 mg/dL (ref 100–199)
HDL: 60 mg/dL (ref >39)
LDL Chol Calc (NIH): 81 mg/dL (ref 0–99)
LDL/HDL Ratio: 1.4 ratio (ref 0.0–3.2)
Triglycerides: 103 mg/dL (ref 0–149)
VLDL Cholesterol Cal: 19 mg/dL (ref 5–40)

## 2024-02-15 LAB — COMPREHENSIVE METABOLIC PANEL WITH GFR
ALT: 19 IU/L (ref 0–32)
AST: 19 IU/L (ref 0–40)
Albumin: 4.3 g/dL (ref 3.9–4.9)
Alkaline Phosphatase: 97 IU/L (ref 44–121)
BUN/Creatinine Ratio: 29 — ABNORMAL HIGH (ref 12–28)
BUN: 23 mg/dL (ref 8–27)
Bilirubin Total: 0.4 mg/dL (ref 0.0–1.2)
CO2: 27 mmol/L (ref 20–29)
Calcium: 9.4 mg/dL (ref 8.7–10.3)
Chloride: 100 mmol/L (ref 96–106)
Creatinine, Ser: 0.79 mg/dL (ref 0.57–1.00)
Globulin, Total: 2.4 g/dL (ref 1.5–4.5)
Glucose: 87 mg/dL (ref 70–99)
Potassium: 4.7 mmol/L (ref 3.5–5.2)
Sodium: 139 mmol/L (ref 134–144)
Total Protein: 6.7 g/dL (ref 6.0–8.5)
eGFR: 80 mL/min/1.73 (ref >59)

## 2024-02-15 LAB — VITAMIN D 25 HYDROXY (VIT D DEFICIENCY, FRACTURES): Vit D, 25-Hydroxy: 39.1 ng/mL (ref 30.0–100.0)

## 2024-02-15 LAB — HGB A1C W/O EAG: Hgb A1c MFr Bld: 5.3 % (ref 4.8–5.6)

## 2024-02-15 NOTE — Telephone Encounter (Signed)
-----   Message from Tinnie MARLA Pro sent at 02/15/2024  8:54 AM EDT ----- Please let her know that her labs look good, potassium and creatinine normal. A1c normal. Vit D and cholesterol also looked good. ----- Message ----- From: Rebecka Memos Lab Results In Sent: 02/15/2024   5:36 AM EDT To: Tinnie MARLA Pro, PA-C

## 2024-02-15 NOTE — Telephone Encounter (Signed)
LVM for patient regarding labs 

## 2024-02-16 ENCOUNTER — Telehealth: Payer: Self-pay | Admitting: Physician Assistant

## 2024-02-16 NOTE — Telephone Encounter (Signed)
 Per patient request, lab results mailed to her-Regina Irwin

## 2024-03-08 DIAGNOSIS — R001 Bradycardia, unspecified: Secondary | ICD-10-CM | POA: Diagnosis not present

## 2024-03-09 ENCOUNTER — Telehealth: Payer: Self-pay

## 2024-03-09 NOTE — Telephone Encounter (Signed)
 Zio called that they pt Holter monitor result print and give it to lauren

## 2024-03-17 ENCOUNTER — Telehealth: Payer: Self-pay

## 2024-03-17 NOTE — Telephone Encounter (Signed)
 Lmom to  call us  back and make appt to discuss  Holter monitor with DR fernand

## 2024-03-20 ENCOUNTER — Telehealth: Payer: Self-pay | Admitting: Internal Medicine

## 2024-03-20 ENCOUNTER — Telehealth: Payer: Self-pay

## 2024-03-20 NOTE — Telephone Encounter (Signed)
 Received vm from patient to put her on DFK 03/21/24 schedule @ 9:20-Toni

## 2024-03-20 NOTE — Telephone Encounter (Signed)
 Lmom pt need appt for review Holter monitor

## 2024-03-21 ENCOUNTER — Ambulatory Visit: Admitting: Internal Medicine

## 2024-03-21 ENCOUNTER — Encounter: Payer: Self-pay | Admitting: Internal Medicine

## 2024-03-21 ENCOUNTER — Other Ambulatory Visit: Payer: Self-pay

## 2024-03-21 ENCOUNTER — Telehealth: Payer: Self-pay

## 2024-03-21 VITALS — BP 108/65 | HR 47 | Temp 98.0°F | Resp 16 | Ht 64.0 in | Wt 102.4 lb

## 2024-03-21 DIAGNOSIS — R0789 Other chest pain: Secondary | ICD-10-CM

## 2024-03-21 DIAGNOSIS — R001 Bradycardia, unspecified: Secondary | ICD-10-CM

## 2024-03-21 DIAGNOSIS — E039 Hypothyroidism, unspecified: Secondary | ICD-10-CM | POA: Diagnosis not present

## 2024-03-21 MED ORDER — LEVOTHYROXINE SODIUM 75 MCG PO TABS: 75.0000 ug | ORAL_TABLET | Freq: Every day | ORAL | 5 refills | Status: DC

## 2024-03-21 MED ORDER — LEVOTHYROXINE SODIUM 75 MCG PO TABS: ORAL_TABLET | ORAL | 5 refills | Status: AC

## 2024-03-21 NOTE — Telephone Encounter (Signed)
 Pt called back that she is taking levothyroxine  75 mcg 6 days a week and skip every Sunday  day by endocrinology and also d/c famotidine  and omeprazole  as per pt is not taking and also sent med to [facility] as per [facility]

## 2024-03-21 NOTE — Progress Notes (Signed)
 [facility] [address] [place], [place] [address]  Internal MEDICINE  Office Visit Note  Patient Name: Regina Irwin  [id number]  [id number]  Date of Service: 03/21/2024  Chief Complaint  Patient presents with   Follow-up    Here to review holter monitor results    HPI  Pt is here to review holter monitor. She was evaluated for chest pain, and bradycardia, has been having chest pain with exertion ( poor historian, thinks this due to stomach problems and heartburn 2.   Results of holter monitor are abnormal, Symptomatic bradycardia noted 3.  Pt was taking Norvasc  for raynaud's but was stopped due to intolerance  4.  She was unable to get IV infusion for Biphosphonates due to incidental finding of bradycardia   Current Medication: Outpatient Encounter Medications as of 03/21/2024  Medication Sig Note   Ca Phosphate-Cholecalciferol 200-200 MG-UNIT CHEW Chew by mouth.    famotidine  (PEPCID ) 20 MG tablet Take 1 tablet (20 mg total) by mouth 2 (two) times daily. 12/29/2021: PRN    Multiple Vitamin (MULTI-VITAMINS) TABS Take by mouth.    omeprazole  (PRILOSEC) 40 MG capsule TAKE 1 CAPSULE (40 MG TOTAL) BY MOUTH DAILY. 12/29/2021: PRN    [DISCONTINUED] alendronate  (FOSAMAX ) 70 MG tablet TAKE 1 TABLET BY MOUTH ONCE A WEEK. TAKE WITH A FULL GLASS OF WATER ON AN EMPTY STOMACH.    [DISCONTINUED] amLODipine  (NORVASC ) 2.5 MG tablet Take 1 tablet (2.5 mg total) by mouth daily.    [DISCONTINUED] levothyroxine  (SYNTHROID ) 75 MCG tablet TAKE 1 TABLET BY MOUTH EVERY DAY BEFORE BREAKFAST    levothyroxine  (SYNTHROID ) 75 MCG tablet Take 1 tablet (75 mcg total) by mouth daily before breakfast.    No facility-administered encounter medications on file as of 03/21/2024.    Surgical History: Past Surgical History:  Procedure Laterality Date   COLONOSCOPY WITH PROPOFOL  N/A 02/16/2022   Procedure: COLONOSCOPY WITH PROPOFOL ;  Surgeon: Unk Corinn Skiff, MD;  Location: [facility] ENDOSCOPY;   Service: Gastroenterology;  Laterality: N/A;   ESOPHAGOGASTRODUODENOSCOPY (EGD) WITH PROPOFOL  N/A 02/16/2022   Procedure: ESOPHAGOGASTRODUODENOSCOPY (EGD) WITH PROPOFOL ;  Surgeon: Unk Corinn Skiff, MD;  Location: [facility] ENDOSCOPY;  Service: Gastroenterology;  Laterality: N/A;   OTHER SURGICAL HISTORY Left    bursal sac removed from finger   tumor removed  right foot      Medical History: Past Medical History:  Diagnosis Date   GERD (gastroesophageal reflux disease)    Thyroid  disease     Family History: Family History  Problem Relation Age of Onset   Gallstones Mother    Melanoma Mother    Diabetes Father    Lung cancer Father    Cancer Maternal Grandmother    Drug abuse Paternal Grandmother    Drug abuse Paternal Grandfather    Melanoma Brother    Breast cancer Neg Hx     Social History   Socioeconomic History   Marital status: Single    Spouse name: Not on file   Number of children: Not on file   Years of education: Not on file   Highest education level: Not on file  Occupational History   Not on file  Tobacco Use   Smoking status: Never   Smokeless tobacco: Never  Vaping Use   Vaping status: Never Used  Substance and Sexual Activity   Alcohol use: Never   Drug use: Never   Sexual activity: Not on file  Other Topics Concern   Not on file  Social History Narrative  Not on file   Social Drivers of Health   Financial Resource Strain: Not on file  Food Insecurity: Not on file  Transportation Needs: Not on file  Physical Activity: Not on file  Stress: Not on file  Social Connections: Not on file  Intimate Partner Violence: Not on file      Review of Systems  Constitutional:  Negative for chills, fatigue and unexpected weight change.  HENT:  Positive for postnasal drip. Negative for congestion, rhinorrhea, sneezing and sore throat.   Eyes:  Negative for redness.  Respiratory:  Positive for chest tightness. Negative for cough and shortness of breath.    Cardiovascular:  Positive for palpitations. Negative for chest pain.  Gastrointestinal:  Negative for abdominal pain, constipation, diarrhea, nausea and vomiting.  Genitourinary:  Negative for dysuria and frequency.  Musculoskeletal:  Negative for arthralgias, back pain, joint swelling and neck pain.  Skin:  Negative for rash.  Neurological: Negative.  Negative for tremors and numbness.  Hematological:  Negative for adenopathy. Does not bruise/bleed easily.  Psychiatric/Behavioral:  Negative for behavioral problems (Depression), sleep disturbance and suicidal ideas. The patient is not nervous/anxious.     Vital Signs: BP 108/65   Pulse (!) 47   Temp 98 F (36.7 C)   Resp 16   Ht 5' 4 (1.626 m)   Wt 102 lb 6.4 oz (46.4 kg)   SpO2 100%   BMI 17.58 kg/m    Physical Exam Constitutional:      Appearance: Normal appearance.  HENT:     Head: Normocephalic and atraumatic.     Nose: Nose normal.     Mouth/Throat:     Mouth: Mucous membranes are moist.     Pharynx: No posterior oropharyngeal erythema.  Eyes:     Extraocular Movements: Extraocular movements intact.     Pupils: Pupils are equal, round, and reactive to light.  Cardiovascular:     Rate and Rhythm: Bradycardia present.     Pulses: Normal pulses.     Heart sounds: Normal heart sounds. No murmur heard. Pulmonary:     Effort: Pulmonary effort is normal.     Breath sounds: Normal breath sounds.  Neurological:     General: No focal deficit present.     Mental Status: She is alert.  Psychiatric:        Mood and Affect: Mood normal.        Behavior: Behavior normal.     Assessment/Plan: 1. Other chest pain (Primary) Results of Holter monitor discussed with pt, answered questions. Advised for cardiology evaluation asap  - Ambulatory referral to Cardiology  2. Bradycardia Baseline EKG is abnormal  - Ambulatory referral to Cardiology - EKG 12-Lead  3. Acquired hypothyroidism Continue synthroid  as before, TSH  is slightly suppressed with normal free T4, might need to adjust dosing in future  - levothyroxine  (SYNTHROID ) 75 MCG tablet; Take 1 tablet (75 mcg total) by mouth daily before breakfast.  Dispense: 30 tablet; Refill: 5   General Counseling: Regina Irwin verbalizes understanding of the findings of todays visit and agrees with plan of treatment. I have discussed any further diagnostic evaluation that may be needed or ordered today. We also reviewed her medications today. she has been encouraged to call the office with any questions or concerns that should arise related to todays visit.    Orders Placed This Encounter  Procedures   Ambulatory referral to Cardiology   EKG 12-Lead    Meds ordered this encounter  Medications   levothyroxine  (  SYNTHROID ) 75 MCG tablet    Sig: Take 1 tablet (75 mcg total) by mouth daily before breakfast.    Dispense:  30 tablet    Refill:  5    Total time spent:45 Minutes Time spent includes review of chart, medications, test results, and follow up plan with the patient.   [place] Controlled Substance Database was reviewed by me.   Dr Lillianna Sabel M Stephany Poorman Internal medicine

## 2024-03-21 NOTE — Telephone Encounter (Signed)
 Lvm for patient to confirm Synthroid  dose.

## 2024-03-23 ENCOUNTER — Ambulatory Visit: Admitting: Cardiovascular Disease

## 2024-03-23 ENCOUNTER — Encounter: Payer: Self-pay | Admitting: Cardiovascular Disease

## 2024-03-23 VITALS — BP 110/60 | Ht 64.0 in | Wt 102.8 lb

## 2024-03-23 DIAGNOSIS — R0789 Other chest pain: Secondary | ICD-10-CM

## 2024-03-23 DIAGNOSIS — R42 Dizziness and giddiness: Secondary | ICD-10-CM | POA: Diagnosis not present

## 2024-03-23 DIAGNOSIS — K219 Gastro-esophageal reflux disease without esophagitis: Secondary | ICD-10-CM

## 2024-03-23 DIAGNOSIS — R001 Bradycardia, unspecified: Secondary | ICD-10-CM

## 2024-03-23 DIAGNOSIS — R002 Palpitations: Secondary | ICD-10-CM | POA: Diagnosis not present

## 2024-03-23 DIAGNOSIS — R0602 Shortness of breath: Secondary | ICD-10-CM

## 2024-03-23 DIAGNOSIS — I471 Supraventricular tachycardia, unspecified: Secondary | ICD-10-CM

## 2024-03-23 DIAGNOSIS — Z131 Encounter for screening for diabetes mellitus: Secondary | ICD-10-CM

## 2024-03-23 DIAGNOSIS — Z0130 Encounter for examination of blood pressure without abnormal findings: Secondary | ICD-10-CM

## 2024-03-23 NOTE — Progress Notes (Signed)
 Cardiology Office Note   Date:  03/23/2024   ID:  [name], DOB [birth year], MRN [id number]  PCP:  Kristina Tinnie POUR, PA-C  Cardiologist:  Denyse Bathe, MD      History of Present Illness: Regina Irwin is a 68 y.o. female who presents for  Chief Complaint  Patient presents with   Follow-up    consult    [id number] feels dizzy once in a while and had pulse too low at infusion center for osteoporosis. Has indigestion, and has chest pain, and SOB.Has tried taking zantac/Protonix/famotidine  without much relief.  Patient states that she has he still having sinus bradycardia for years and only occasionally feels dizzy when she bends over.  She never felt like she is going to pass out or have syncopal episode.  There is no family history of having syncope or sudden death.  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  This EKG was done on 03/21/2024 she also had a Holter done a week ago which showed sinus rhythm as the baseline rhythm with heart rate ranging from 33-1 28.  There were no pauses and there was no V. tach but there was 1 episode of 9 beats of SVT.  It is unclear if sinus bradycardia alone is causing her to be symptomatic with dizziness.  Chest Pain  This is a new problem. The current episode started 1 to 4 weeks ago. The onset quality is gradual. The problem has been waxing and waning. The pain is at a severity of 5/10. The pain is mild. Associated symptoms include dizziness, exertional chest pressure, malaise/fatigue, shortness of breath and weakness. Pertinent negatives include no nausea, near-syncope, orthopnea, palpitations, PND, syncope or vomiting.      Past Medical History:  Diagnosis Date   GERD (gastroesophageal reflux disease)    Thyroid  disease      Past Surgical History:  Procedure Laterality Date   COLONOSCOPY WITH PROPOFOL  N/A 02/16/2022   Procedure: COLONOSCOPY WITH  PROPOFOL ;  Surgeon: Unk Corinn Skiff, MD;  Location: [facility] ENDOSCOPY;  Service: Gastroenterology;  Laterality: N/A;   ESOPHAGOGASTRODUODENOSCOPY (EGD) WITH PROPOFOL  N/A 02/16/2022   Procedure: ESOPHAGOGASTRODUODENOSCOPY (EGD) WITH PROPOFOL ;  Surgeon: Unk Corinn Skiff, MD;  Location: [facility] ENDOSCOPY;  Service: Gastroenterology;  Laterality: N/A;   OTHER SURGICAL HISTORY Left    bursal sac removed from finger   tumor removed  right foot       Current Outpatient Medications  Medication Sig Dispense Refill   Ca Phosphate-Cholecalciferol 200-200 MG-UNIT CHEW Chew by mouth.     levothyroxine  (SYNTHROID ) 75 MCG tablet Take 1 tab po daily 6 days a week skip every sunday 26 tablet 5   Multiple Vitamin (MULTI-VITAMINS) TABS Take by mouth.     No current facility-administered medications for this visit.    Allergies:   Patient has no known allergies.    Social History:   reports that she has never smoked. She has never used smokeless tobacco. She reports that she does not drink alcohol and does not use drugs.   Family History:  family history includes Cancer in her maternal grandmother; Diabetes in her father; Drug abuse in her paternal grandfather and paternal grandmother; Gallstones in her mother; Lung cancer in her father; Melanoma in her brother and mother.    ROS:     Review of Systems  Constitutional:  Positive for malaise/fatigue.  HENT: Negative.    Eyes:  Negative.   Respiratory:  Positive for shortness of breath.   Cardiovascular:  Positive for chest pain. Negative for palpitations, orthopnea, syncope, PND and near-syncope.  Gastrointestinal: Negative.  Negative for nausea and vomiting.  Genitourinary: Negative.   Musculoskeletal: Negative.   Skin: Negative.   Neurological:  Positive for dizziness and weakness.  Endo/Heme/Allergies: Negative.   Psychiatric/Behavioral: Negative.    All other systems reviewed and are negative.     All other systems are reviewed and  negative.    PHYSICAL EXAM: VS:  BP 110/60   Ht 5' 4 (1.626 m)   Wt 102 lb 12.8 oz (46.6 kg)   BMI 17.65 kg/m  , BMI Body mass index is 17.65 kg/m. Last weight:  Wt Readings from Last 3 Encounters:  03/23/24 102 lb 12.8 oz (46.6 kg)  03/21/24 102 lb 6.4 oz (46.4 kg)  02/14/24 104 lb (47.2 kg)     Physical Exam Constitutional:      Appearance: Normal appearance.  Cardiovascular:     Rate and Rhythm: Normal rate and regular rhythm.     Heart sounds: Normal heart sounds.  Pulmonary:     Effort: Pulmonary effort is normal.     Breath sounds: Normal breath sounds.  Musculoskeletal:     Right lower leg: No edema.     Left lower leg: No edema.  Neurological:     Mental Status: She is alert.       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  Holter monitor showed sinus bradycardia 33 228 bpm with 9 beats run of SVT.  There were no pauses or no high-grade AV block.  Recent Labs: 04/05/2023: TSH 1.010 02/14/2024: ALT 19; BUN 23; Creatinine, Ser 0.79; Potassium 4.7; Sodium 139    Lipid Panel    Component Value Date/Time   CHOL 160 02/14/2024 1608   TRIG 103 02/14/2024 1608   HDL 60 02/14/2024 1608   LDLCALC 81 02/14/2024 1608      Other studies Reviewed: Additional studies/ records that were reviewed today include:  Review of the above records demonstrates:       No data to display            ASSESSMENT AND PLAN:    ICD-10-CM   1. Palpitations  R00.2 PCV ECHOCARDIOGRAM COMPLETE    MYOCARDIAL PERFUSION IMAGING    2. Gastroesophageal reflux disease without esophagitis  K21.9 PCV ECHOCARDIOGRAM COMPLETE    MYOCARDIAL PERFUSION IMAGING   Patient refused to try Zantac, famotidine  and Protonix as that says they do not work.  Advised taking Maalox or Mylanta.    3. Other chest pain  R07.89 PCV ECHOCARDIOGRAM COMPLETE    MYOCARDIAL PERFUSION IMAGING   Patient has occasional chest pain  radiating to the back associated with EKG changes of old ASMI,NON-SPECFIC ST changes.  Advise echo and a stress test.    4. SOB (shortness of breath)  R06.02 PCV ECHOCARDIOGRAM COMPLETE    MYOCARDIAL PERFUSION IMAGING   Stress test will elucidate if the patient is symptomatic from sinus bradycardia or has underlying coronary artery disease.    5. Dizziness  R42 PCV ECHOCARDIOGRAM COMPLETE    MYOCARDIAL PERFUSION IMAGING    6. Sinus bradycardia  R00.1 PCV ECHOCARDIOGRAM COMPLETE    MYOCARDIAL PERFUSION IMAGING   Holter  showed heart sinus bradycardia rate 33 and 1 episodes of 128 bpm of SVT of 9 beats.  There is no pauses/high-grade AV block  thus no indication ppm.    7. SVT (supraventricular tachycardia)  I47.10 PCV ECHOCARDIOGRAM COMPLETE    MYOCARDIAL PERFUSION IMAGING   Holter monitor showed SVT at a rate 128 bpm about 9 beats.  She denies any palpitation with occasional dizziness.  No syncope.       Problem List Items Addressed This Visit       Digestive   Gastroesophageal reflux disease   Relevant Orders   PCV ECHOCARDIOGRAM COMPLETE   MYOCARDIAL PERFUSION IMAGING     Other   Palpitations - Primary   Relevant Orders   PCV ECHOCARDIOGRAM COMPLETE   MYOCARDIAL PERFUSION IMAGING   Other Visit Diagnoses       Other chest pain       Patient has occasional chest pain radiating to the back associated with EKG changes of old ASMI,NON-SPECFIC ST changes.  Advise echo and a stress test.   Relevant Orders   PCV ECHOCARDIOGRAM COMPLETE   MYOCARDIAL PERFUSION IMAGING     SOB (shortness of breath)       Stress test will elucidate if the patient is symptomatic from sinus bradycardia or has underlying coronary artery disease.   Relevant Orders   PCV ECHOCARDIOGRAM COMPLETE   MYOCARDIAL PERFUSION IMAGING     Dizziness       Relevant Orders   PCV ECHOCARDIOGRAM COMPLETE   MYOCARDIAL PERFUSION IMAGING     Sinus bradycardia       Holter  showed heart sinus bradycardia rate 33 and  1 episodes of 128 bpm of SVT of 9 beats.  There is no pauses/high-grade AV block thus no indication ppm.   Relevant Orders   PCV ECHOCARDIOGRAM COMPLETE   MYOCARDIAL PERFUSION IMAGING     SVT (supraventricular tachycardia)       Holter monitor showed SVT at a rate 128 bpm about 9 beats.  She denies any palpitation with occasional dizziness.  No syncope.   Relevant Orders   PCV ECHOCARDIOGRAM COMPLETE   MYOCARDIAL PERFUSION IMAGING          Disposition:   Return in about 4 weeks (around 04/20/2024) for echo, stress test and f/u.    Total time spent: 50 minutes  Signed,  Denyse Bathe, MD  03/23/2024 2:28 PM    [facility]

## 2024-04-10 ENCOUNTER — Ambulatory Visit (INDEPENDENT_AMBULATORY_CARE_PROVIDER_SITE_OTHER): Payer: BC Managed Care – PPO | Admitting: Physician Assistant

## 2024-04-10 ENCOUNTER — Encounter: Payer: Self-pay | Admitting: Physician Assistant

## 2024-04-10 VITALS — BP 106/62 | HR 57 | Temp 98.0°F | Resp 16 | Ht 64.0 in | Wt 103.0 lb

## 2024-04-10 DIAGNOSIS — Z0001 Encounter for general adult medical examination with abnormal findings: Secondary | ICD-10-CM | POA: Diagnosis not present

## 2024-04-10 DIAGNOSIS — R300 Dysuria: Secondary | ICD-10-CM

## 2024-04-10 DIAGNOSIS — R001 Bradycardia, unspecified: Secondary | ICD-10-CM

## 2024-04-10 DIAGNOSIS — Z1231 Encounter for screening mammogram for malignant neoplasm of breast: Secondary | ICD-10-CM

## 2024-04-10 NOTE — Progress Notes (Signed)
 [facility] [address] [place], [place] [address]  Internal MEDICINE  Office Visit Note  Patient Name: Regina Irwin  [id number]  [id number]  Date of Service: 04/10/2024  Chief Complaint  Patient presents with   Annual Exam     HPI Pt is here for routine health maintenance examination -colonoscopy UTD, due for mammogram -After abnormal zio heart monitor results she was referred to cardiology. She Friday has an echo, then in another week has a stress test before following up with cardiology -some dental pain intermittently, sleeping differently and seems to be better, -leg feels better since being more active   Current Medication: Outpatient Encounter Medications as of 04/10/2024  Medication Sig   Ca Phosphate-Cholecalciferol 200-200 MG-UNIT CHEW Chew by mouth.   levothyroxine  (SYNTHROID ) 75 MCG tablet Take 1 tab po daily 6 days a week skip every sunday   Multiple Vitamin (MULTI-VITAMINS) TABS Take by mouth.   No facility-administered encounter medications on file as of 04/10/2024.    Surgical History: Past Surgical History:  Procedure Laterality Date   COLONOSCOPY WITH PROPOFOL  N/A 02/16/2022   Procedure: COLONOSCOPY WITH PROPOFOL ;  Surgeon: Unk Corinn Skiff, MD;  Location: [facility] ENDOSCOPY;  Service: Gastroenterology;  Laterality: N/A;   ESOPHAGOGASTRODUODENOSCOPY (EGD) WITH PROPOFOL  N/A 02/16/2022   Procedure: ESOPHAGOGASTRODUODENOSCOPY (EGD) WITH PROPOFOL ;  Surgeon: Unk Corinn Skiff, MD;  Location: [facility] ENDOSCOPY;  Service: Gastroenterology;  Laterality: N/A;   OTHER SURGICAL HISTORY Left    bursal sac removed from finger   tumor removed  right foot      Medical History: Past Medical History:  Diagnosis Date   GERD (gastroesophageal reflux disease)    Thyroid  disease     Family History: Family History  Problem Relation Age of Onset   Gallstones Mother    Melanoma Mother    Diabetes Father    Lung cancer Father    Cancer Maternal  Grandmother    Drug abuse Paternal Grandmother    Drug abuse Paternal Grandfather    Melanoma Brother    Breast cancer Neg Hx       Review of Systems   Vital Signs: BP (!) 100/53   Pulse (!) 57   Temp 98 F (36.7 C)   Resp 16   Ht 5' 4 (1.626 m)   Wt 103 lb (46.7 kg)   SpO2 96%   BMI 17.68 kg/m    Physical Exam   LABS: Recent Results (from the past 2160 hours)  Lipid Panel With LDL/HDL Ratio     Status: None   Collection Time: 02/14/24  4:08 PM  Result Value Ref Range   Cholesterol, Total 160 100 - 199 mg/dL   Triglycerides 896 0 - 149 mg/dL   HDL 60 >60 mg/dL   VLDL Cholesterol Cal 19 5 - 40 mg/dL   LDL Chol Calc (NIH) 81 0 - 99 mg/dL   LDL/HDL Ratio 1.4 0.0 - 3.2 ratio    Comment:                                     LDL/HDL Ratio                                             Men  Women  1/2 Avg.Risk  1.0    1.5                                   Avg.Risk  3.6    3.2                                2X Avg.Risk  6.2    5.0                                3X Avg.Risk  8.0    6.1   Comprehensive metabolic panel with GFR     Status: Abnormal   Collection Time: 02/14/24  4:08 PM  Result Value Ref Range   Glucose 87 70 - 99 mg/dL   BUN 23 8 - 27 mg/dL   Creatinine, Ser 9.20 0.57 - 1.00 mg/dL   eGFR 80 >40 fO/fpw/8.26   BUN/Creatinine Ratio 29 (H) 12 - 28   Sodium 139 134 - 144 mmol/L   Potassium 4.7 3.5 - 5.2 mmol/L   Chloride 100 96 - 106 mmol/L   CO2 27 20 - 29 mmol/L   Calcium 9.4 8.7 - 10.3 mg/dL   Total Protein 6.7 6.0 - 8.5 g/dL   Albumin 4.3 3.9 - 4.9 g/dL   Globulin, Total 2.4 1.5 - 4.5 g/dL   Bilirubin Total 0.4 0.0 - 1.2 mg/dL   Alkaline Phosphatase 97 44 - 121 IU/L   AST 19 0 - 40 IU/L   ALT 19 0 - 32 IU/L  Hgb A1C w/o eAG     Status: None   Collection Time: 02/14/24  4:08 PM  Result Value Ref Range   Hgb A1c MFr Bld 5.3 4.8 - 5.6 %    Comment:          Prediabetes: 5.7 - 6.4          Diabetes: >6.4           Glycemic control for adults with diabetes: <7.0   VITAMIN D  25 Hydroxy (Vit-D Deficiency, Fractures)     Status: None   Collection Time: 02/14/24  4:08 PM  Result Value Ref Range   Vit D, 25-Hydroxy 39.1 30.0 - 100.0 ng/mL    Comment: Vitamin D  deficiency has been defined by the Institute of Medicine and an Endocrine Society practice guideline as a level of serum 25-OH vitamin D  less than 20 ng/mL (1,2). The Endocrine Society went on to further define vitamin D  insufficiency as a level between 21 and 29 ng/mL (2). 1. IOM (Institute of Medicine). 2010. Dietary reference    intakes for calcium and D. [place]: The    [employer]. 2. Holick MF, Binkley [place], Bischoff-Ferrari HA, et al.    Evaluation, treatment, and prevention of vitamin D     deficiency: an Endocrine Society clinical practice    guideline. JCEM. 2011 Jul; 96(7):1911-30.     SABRAMAMM    Assessment/Plan:   General Counseling: Lisa verbalizes understanding of the findings of todays visit and agrees with plan of treatment. I have discussed any further diagnostic evaluation that may be needed or ordered today. We also reviewed her medications today. she has been encouraged to call the office with any questions or concerns that should arise related to todays visit.    Counseling:  No orders of the defined types were placed in this encounter.   No orders of the defined types were placed in this encounter.   This patient was seen by Tinnie Pro, PA-C in collaboration with Dr. Sigrid Bathe as a part of collaborative care agreement.  Total time spent:*** Minutes  Time spent includes review of chart, medications, test results, and follow up plan with the patient.     Sigrid CHRISTELLA Bathe, MD  Internal Medicine

## 2024-04-11 LAB — UA/M W/RFLX CULTURE, ROUTINE
Bilirubin, UA: NEGATIVE
Glucose, UA: NEGATIVE
Ketones, UA: NEGATIVE
Leukocytes,UA: NEGATIVE
Nitrite, UA: NEGATIVE
Protein,UA: NEGATIVE
RBC, UA: NEGATIVE
Specific Gravity, UA: 1.013 (ref 1.005–1.030)
Urobilinogen, Ur: 0.2 mg/dL (ref 0.2–1.0)
pH, UA: 6 (ref 5.0–7.5)

## 2024-04-11 LAB — MICROSCOPIC EXAMINATION
Bacteria, UA: NONE SEEN
Casts: NONE SEEN /LPF
Epithelial Cells (non renal): NONE SEEN /HPF (ref 0–10)
WBC, UA: NONE SEEN /HPF (ref 0–5)

## 2024-04-14 ENCOUNTER — Ambulatory Visit

## 2024-04-14 DIAGNOSIS — I371 Nonrheumatic pulmonary valve insufficiency: Secondary | ICD-10-CM

## 2024-04-14 DIAGNOSIS — R0602 Shortness of breath: Secondary | ICD-10-CM

## 2024-04-14 DIAGNOSIS — R0789 Other chest pain: Secondary | ICD-10-CM

## 2024-04-14 DIAGNOSIS — R42 Dizziness and giddiness: Secondary | ICD-10-CM

## 2024-04-14 DIAGNOSIS — R002 Palpitations: Secondary | ICD-10-CM

## 2024-04-14 DIAGNOSIS — I340 Nonrheumatic mitral (valve) insufficiency: Secondary | ICD-10-CM

## 2024-04-14 DIAGNOSIS — R001 Bradycardia, unspecified: Secondary | ICD-10-CM

## 2024-04-14 DIAGNOSIS — K219 Gastro-esophageal reflux disease without esophagitis: Secondary | ICD-10-CM

## 2024-04-14 DIAGNOSIS — I361 Nonrheumatic tricuspid (valve) insufficiency: Secondary | ICD-10-CM

## 2024-04-14 DIAGNOSIS — I471 Supraventricular tachycardia, unspecified: Secondary | ICD-10-CM

## 2024-04-27 ENCOUNTER — Ambulatory Visit

## 2024-04-27 DIAGNOSIS — R001 Bradycardia, unspecified: Secondary | ICD-10-CM | POA: Diagnosis not present

## 2024-04-27 DIAGNOSIS — I471 Supraventricular tachycardia, unspecified: Secondary | ICD-10-CM

## 2024-04-27 DIAGNOSIS — R42 Dizziness and giddiness: Secondary | ICD-10-CM

## 2024-04-27 DIAGNOSIS — R0602 Shortness of breath: Secondary | ICD-10-CM

## 2024-04-27 DIAGNOSIS — R0789 Other chest pain: Secondary | ICD-10-CM | POA: Diagnosis not present

## 2024-04-27 DIAGNOSIS — R002 Palpitations: Secondary | ICD-10-CM

## 2024-04-27 DIAGNOSIS — K219 Gastro-esophageal reflux disease without esophagitis: Secondary | ICD-10-CM

## 2024-05-02 ENCOUNTER — Encounter: Payer: Self-pay | Admitting: Cardiovascular Disease

## 2024-05-02 ENCOUNTER — Encounter: Payer: Self-pay | Admitting: Dietician

## 2024-05-02 ENCOUNTER — Encounter: Attending: Physician Assistant | Admitting: Dietician

## 2024-05-02 ENCOUNTER — Ambulatory Visit: Admitting: Cardiovascular Disease

## 2024-05-02 VITALS — Ht 64.0 in | Wt 104.8 lb

## 2024-05-02 VITALS — BP 104/62 | HR 54 | Ht 64.0 in | Wt 104.0 lb

## 2024-05-02 DIAGNOSIS — R0789 Other chest pain: Secondary | ICD-10-CM | POA: Diagnosis not present

## 2024-05-02 DIAGNOSIS — R002 Palpitations: Secondary | ICD-10-CM

## 2024-05-02 DIAGNOSIS — R0602 Shortness of breath: Secondary | ICD-10-CM

## 2024-05-02 DIAGNOSIS — R001 Bradycardia, unspecified: Secondary | ICD-10-CM | POA: Diagnosis not present

## 2024-05-02 DIAGNOSIS — K219 Gastro-esophageal reflux disease without esophagitis: Secondary | ICD-10-CM

## 2024-05-02 DIAGNOSIS — R636 Underweight: Secondary | ICD-10-CM | POA: Diagnosis not present

## 2024-05-02 DIAGNOSIS — I471 Supraventricular tachycardia, unspecified: Secondary | ICD-10-CM

## 2024-05-02 DIAGNOSIS — R072 Precordial pain: Secondary | ICD-10-CM

## 2024-05-02 MED ORDER — PANTOPRAZOLE SODIUM 40 MG PO TBEC: 40.0000 mg | DELAYED_RELEASE_TABLET | Freq: Every day | ORAL | 1 refills | Status: DC

## 2024-05-02 NOTE — Patient Instructions (Addendum)
 Depending on your results with cardiologists, look to increase fluids and sodium gradually if indicated!  Continue to add vegetable broth to your meals to ensure that you are getting adequate sodium daily.  Consider adding one packet of Liquid IV to your water bottle that you drink during your work day!  Have a bag of Harvest Snaps as a high protein, salty snack.   Consider incorporating resistance exercise 2-3 times up to 30 minutes at a time depending on your tolerance/cardiovascular health.

## 2024-05-02 NOTE — Progress Notes (Signed)
 Medical Nutrition Therapy  Appointment Start time:  65  Appointment End time:  1155  Primary concerns today: Being healthy/strength  Referral diagnosis: R63.6 - Underweight Preferred learning style: No preference indicated Learning readiness: Ready   NUTRITION ASSESSMENT   Anthropometrics Ht: 64 Wt: 104.8 lbs BMI: 17.99 kg/m2 Weight Change: +2.5 lbs in 3 months Goal Weight: 120-125 lbs.   Clinical Medical Hx: GERD, Dysphagia, Colitis, Hypoglycemia, Weight Loss, Hypothyroidism, Neoplasm of liver Medications: Levothyroxine  Labs: (12/01/2023) TSH - 0.508 (low), T4 - 1.15 (in range), Potassium - 5.4 (high), CO2 - 32.9 (high), BUN - 26 (high) (NEW 02/14/2024): BUN/Creatinine ratio - 29 (High), BUN - 23 (In range), Potassium - 4.7 (In range) Notable Signs/Symptoms: Frail   Lifestyle & Dietary Hx Pt reports going for infusion for osteoporosis and was found to have bradycardia, will be seeing the cardiologist to review results of ECG and stress test this afternoon. Pt reports they were recommended resistance exercise for bone health by endocrinologist, states they are considering it depending on the result of cardiology tests. Pt reports continuing low energy, especially when sedentary all day, pt states they feel more energetic on days they are not at work and moving around. Pt reports continued high stress levels at work. Pt reports eating protein pasta, and green pea hummus w/ sun butter for extra protein now.     Estimated daily fluid intake: 36 oz Supplements: Caltrate Bone health gummies, Centrum MVI Sleep: Remains poor Stress / self-care: High, Work related Current average weekly physical activity: ADLs   24-Hr Dietary Recall First Meal: Cranberries and apples on protein pasta w/ olive oil, kiwi w/ grape nuts/almond flour/cheerios, 1 tbsp wheat germ Snack:  Second Meal: String beans, cabbage, olives, walnuts, grape nuts w/ almond flour/dates/cheerios, pumpkin  seeds Snack:  Third Meal: 1 cup of almond/soy milk Snack:  Beverages: Water, almond milk     NUTRITION DIAGNOSIS  [place]-3.1 Underweight As related to insufficient energy intake.  As evidenced by BMI of 17.99 kg/m2, Low calorie dietary composition, skipping third meal each day, consistently high stress.   NUTRITION INTERVENTION  Nutrition education (E-1) on the following topics:  Educated patient on the role of increasing calories and protein on building muscle mass and healthy weight gain. Worked with patient to identify sources of protein and healthy fats while following a vegan diet, as well as how to increase calories when preparing food. Educated patient to eat 3 times a day to maximize protein intake.  Educated pt on increasing calories using healthy fats. Educated pt on potential increase in reflux associated with higher fat intake.    Handouts Provided Include  AVS Potassium Food List NEW: ADA Seated Exercises  Learning Style & Readiness for Change Teaching method utilized: Visual & Auditory  Demonstrated degree of understanding via: Teach Back  Barriers to learning/adherence to lifestyle change: Schedule, Stress, GERD, Resistance to change   Goals Established by Pt Depending on your results with cardiologists, look to increase fluids and sodium gradually if indicated! Continue to add vegetable broth to your meals to ensure that you are getting adequate sodium daily. Consider adding one packet of Liquid IV to your water bottle that you drink during your work day! Have a bag of Harvest Snaps as a high protein, salty snack.  Consider incorporating resistance exercise 2-3 times up to 30 minutes at a time depending on your tolerance/cardiovascular health.    MONITORING & EVALUATION Dietary intake, weekly physical activity, and weight gain in 3 months.  Next Steps  Patient is to follow up with RD after getting results from cardiology.

## 2024-05-02 NOTE — Progress Notes (Signed)
 Cardiology Office Note   Date:  05/02/2024   ID:  [name], DOB December 12, 1955, MRN [id number]  PCP:  Kristina Tinnie POUR, PA-C  Cardiologist:  Denyse Bathe, MD      History of Present Illness: Regina Irwin is a 68 y.o. female who presents for  Chief Complaint  Patient presents with   Results    Results of NST and ECHO    OCCASIONAL CHEST PAIN RETROSTERNAL AREA ESPECIALLY BENDING OVER.      Past Medical History:  Diagnosis Date   GERD (gastroesophageal reflux disease)    Thyroid  disease      Past Surgical History:  Procedure Laterality Date   COLONOSCOPY WITH PROPOFOL  N/A 02/16/2022   Procedure: COLONOSCOPY WITH PROPOFOL ;  Surgeon: Unk Corinn Skiff, MD;  Location: [facility] ENDOSCOPY;  Service: Gastroenterology;  Laterality: N/A;   ESOPHAGOGASTRODUODENOSCOPY (EGD) WITH PROPOFOL  N/A 02/16/2022   Procedure: ESOPHAGOGASTRODUODENOSCOPY (EGD) WITH PROPOFOL ;  Surgeon: Unk Corinn Skiff, MD;  Location: [facility] ENDOSCOPY;  Service: Gastroenterology;  Laterality: N/A;   OTHER SURGICAL HISTORY Left    bursal sac removed from finger   tumor removed  right foot       Current Outpatient Medications  Medication Sig Dispense Refill   Ca Phosphate-Cholecalciferol 200-200 MG-UNIT CHEW Chew by mouth.     levothyroxine  (SYNTHROID ) 75 MCG tablet Take 1 tab po daily 6 days a week skip every sunday 26 tablet 5   Multiple Vitamin (MULTI-VITAMINS) TABS Take by mouth.     pantoprazole (PROTONIX) 40 MG tablet Take 1 tablet (40 mg total) by mouth daily. 30 tablet 1   No current facility-administered medications for this visit.    Allergies:   Patient has no known allergies.    Social History:   reports that she has never smoked. She has never used smokeless tobacco. She reports that she does not drink alcohol and does not use drugs.   Family History:  family history includes Cancer in her maternal grandmother; Diabetes in her father; Drug abuse in her paternal grandfather and  paternal grandmother; Gallstones in her mother; Lung cancer in her father; Melanoma in her brother and mother.    ROS:     Review of Systems  Constitutional: Negative.   HENT: Negative.    Eyes: Negative.   Respiratory: Negative.    Gastrointestinal: Negative.   Genitourinary: Negative.   Musculoskeletal: Negative.   Skin: Negative.   Neurological: Negative.   Endo/Heme/Allergies: Negative.   Psychiatric/Behavioral: Negative.    All other systems reviewed and are negative.     All other systems are reviewed and negative.    PHYSICAL EXAM: VS:  BP 104/62   Pulse (!) 54   Ht 5' 4 (1.626 m)   Wt 104 lb (47.2 kg)   SpO2 94%   BMI 17.85 kg/m  , BMI Body mass index is 17.85 kg/m. Last weight:  Wt Readings from Last 3 Encounters:  05/02/24 104 lb (47.2 kg)  05/02/24 104 lb 12.8 oz (47.5 kg)  04/10/24 103 lb (46.7 kg)     Physical Exam Constitutional:      Appearance: Normal appearance.  Cardiovascular:     Rate and Rhythm: Normal rate and regular rhythm.     Heart sounds: Normal heart sounds.  Pulmonary:     Effort: Pulmonary effort is normal.     Breath sounds: Normal breath sounds.  Musculoskeletal:     Right lower leg: No edema.     Left lower leg: No edema.  Neurological:  Mental Status: She is alert.       EKG:   Recent Labs: 02/14/2024: ALT 19; BUN 23; Creatinine, Ser 0.79; Potassium 4.7; Sodium 139    Lipid Panel    Component Value Date/Time   CHOL 160 02/14/2024 1608   TRIG 103 02/14/2024 1608   HDL 60 02/14/2024 1608   LDLCALC 81 02/14/2024 1608      Other studies Reviewed: Additional studies/ records that were reviewed today include:  Review of the above records demonstrates:       No data to display            ASSESSMENT AND PLAN:    ICD-10-CM   1. Other chest pain  R07.89 CT CORONARY MORPH W/CTA COR W/SCORE W/CA W/CM &/OR WO/CM    Basic metabolic panel with GFR    pantoprazole (PROTONIX) 40 MG tablet   Equivacal  stress test , advise CCTA, as HR 54 and atypical chest pains.    2. Palpitations  R00.2 CT CORONARY MORPH W/CTA COR W/SCORE W/CA W/CM &/OR WO/CM    Basic metabolic panel with GFR    pantoprazole (PROTONIX) 40 MG tablet    3. Gastroesophageal reflux disease without esophagitis  K21.9 CT CORONARY MORPH W/CTA COR W/SCORE W/CA W/CM &/OR WO/CM    Basic metabolic panel with GFR    pantoprazole (PROTONIX) 40 MG tablet    4. SOB (shortness of breath)  R06.02 CT CORONARY MORPH W/CTA COR W/SCORE W/CA W/CM &/OR WO/CM    Basic metabolic panel with GFR    pantoprazole (PROTONIX) 40 MG tablet    5. Sinus bradycardia  R00.1 CT CORONARY MORPH W/CTA COR W/SCORE W/CA W/CM &/OR WO/CM    Basic metabolic panel with GFR    pantoprazole (PROTONIX) 40 MG tablet    6. SVT (supraventricular tachycardia)  I47.10 CT CORONARY MORPH W/CTA COR W/SCORE W/CA W/CM &/OR WO/CM    Basic metabolic panel with GFR    pantoprazole (PROTONIX) 40 MG tablet    7. Precordial pain  R07.2 CT CORONARY MORPH W/CTA COR W/SCORE W/CA W/CM &/OR WO/CM    Basic metabolic panel with GFR    pantoprazole (PROTONIX) 40 MG tablet   Has septal wall fixed defect with normal EF, advise CCTA as has Equivacal stress test       Problem List Items Addressed This Visit       Digestive   Gastroesophageal reflux disease   Relevant Medications   pantoprazole (PROTONIX) 40 MG tablet   Other Relevant Orders   CT CORONARY MORPH W/CTA COR W/SCORE W/CA W/CM &/OR WO/CM   Basic metabolic panel with GFR     Other   Palpitations   Relevant Medications   pantoprazole (PROTONIX) 40 MG tablet   Other Relevant Orders   CT CORONARY MORPH W/CTA COR W/SCORE W/CA W/CM &/OR WO/CM   Basic metabolic panel with GFR   Other Visit Diagnoses       Other chest pain    -  Primary   Equivacal stress test , advise CCTA, as HR 54 and atypical chest pains.   Relevant Medications   pantoprazole (PROTONIX) 40 MG tablet   Other Relevant Orders   CT CORONARY MORPH  W/CTA COR W/SCORE W/CA W/CM &/OR WO/CM   Basic metabolic panel with GFR     SOB (shortness of breath)       Relevant Medications   pantoprazole (PROTONIX) 40 MG tablet   Other Relevant Orders   CT CORONARY MORPH W/CTA COR W/SCORE W/CA  W/CM &/OR WO/CM   Basic metabolic panel with GFR     Sinus bradycardia       Relevant Medications   pantoprazole (PROTONIX) 40 MG tablet   Other Relevant Orders   CT CORONARY MORPH W/CTA COR W/SCORE W/CA W/CM &/OR WO/CM   Basic metabolic panel with GFR     SVT (supraventricular tachycardia)       Relevant Medications   pantoprazole (PROTONIX) 40 MG tablet   Other Relevant Orders   CT CORONARY MORPH W/CTA COR W/SCORE W/CA W/CM &/OR WO/CM   Basic metabolic panel with GFR     Precordial pain       Has septal wall fixed defect with normal EF, advise CCTA as has Equivacal stress test   Relevant Medications   pantoprazole (PROTONIX) 40 MG tablet   Other Relevant Orders   CT CORONARY MORPH W/CTA COR W/SCORE W/CA W/CM &/OR WO/CM   Basic metabolic panel with GFR          Disposition:   Return in about 4 weeks (around 05/30/2024) for CCTA  and f/u.    Total time spent: 50 minutes  Signed,  Denyse Bathe, MD  05/02/2024 2:32 PM    [facility]

## 2024-05-03 LAB — BASIC METABOLIC PANEL WITH GFR
BUN/Creatinine Ratio: 26 (ref 12–28)
BUN: 24 mg/dL (ref 8–27)
CO2: 29 mmol/L (ref 20–29)
Calcium: 9.7 mg/dL (ref 8.7–10.3)
Chloride: 101 mmol/L (ref 96–106)
Creatinine, Ser: 0.92 mg/dL (ref 0.57–1.00)
Glucose: 106 mg/dL — ABNORMAL HIGH (ref 70–99)
Potassium: 4.7 mmol/L (ref 3.5–5.2)
Sodium: 143 mmol/L (ref 134–144)
eGFR: 67 mL/min/1.73 (ref >59)

## 2024-05-24 ENCOUNTER — Other Ambulatory Visit: Payer: Self-pay | Admitting: Cardiovascular Disease

## 2024-05-24 DIAGNOSIS — R0789 Other chest pain: Secondary | ICD-10-CM

## 2024-05-24 DIAGNOSIS — R0602 Shortness of breath: Secondary | ICD-10-CM

## 2024-05-24 DIAGNOSIS — I471 Supraventricular tachycardia, unspecified: Secondary | ICD-10-CM

## 2024-05-24 DIAGNOSIS — R001 Bradycardia, unspecified: Secondary | ICD-10-CM

## 2024-05-24 DIAGNOSIS — K219 Gastro-esophageal reflux disease without esophagitis: Secondary | ICD-10-CM

## 2024-05-24 DIAGNOSIS — R072 Precordial pain: Secondary | ICD-10-CM

## 2024-05-24 DIAGNOSIS — R002 Palpitations: Secondary | ICD-10-CM

## 2024-05-25 ENCOUNTER — Ambulatory Visit: Admitting: Cardiovascular Disease

## 2024-05-31 ENCOUNTER — Telehealth (HOSPITAL_COMMUNITY): Payer: Self-pay | Admitting: Emergency Medicine

## 2024-05-31 NOTE — Telephone Encounter (Signed)
 Attempted to call patient regarding upcoming cardiac CT appointment. Left message on voicemail with name and callback number Rockwell Alexandria RN Navigator Cardiac [facility] [phone] Office [phone] Cell

## 2024-06-01 ENCOUNTER — Ambulatory Visit: Admission: RE | Admit: 2024-06-01

## 2024-06-06 ENCOUNTER — Ambulatory Visit: Admitting: Cardiovascular Disease

## 2024-06-06 ENCOUNTER — Encounter: Payer: Self-pay | Admitting: Cardiovascular Disease

## 2024-06-19 ENCOUNTER — Telehealth: Payer: Self-pay

## 2024-06-19 NOTE — Telephone Encounter (Signed)
 Pt is requesting a copy of her most recent lab results be mailed to her house.

## 2024-07-11 NOTE — Progress Notes (Signed)
Opened in error A user error has taken place:   .

## 2024-07-28 ENCOUNTER — Encounter: Payer: Self-pay | Attending: Physician Assistant | Admitting: Dietician

## 2024-07-28 ENCOUNTER — Encounter: Payer: Self-pay | Admitting: Dietician

## 2024-07-28 VITALS — Ht 64.0 in | Wt 105.8 lb

## 2024-07-28 DIAGNOSIS — R636 Underweight: Secondary | ICD-10-CM

## 2024-07-28 NOTE — Progress Notes (Signed)
 Medical Nutrition Therapy  Appointment Start time:  1100  Appointment End time:  1130  Primary concerns today: Being healthy/strength  Referral diagnosis: R63.6 - Underweight Preferred learning style: No preference indicated Learning readiness: Ready   NUTRITION ASSESSMENT   Anthropometrics Ht: 64 Wt: 105.8 lbs BMI: 18.16 kg/m2 Weight Change: +1 lb in 3 months Goal Weight: 120-125 lbs.   Clinical Medical Hx: GERD, Dysphagia, Colitis, Hypoglycemia, Weight Loss, Hypothyroidism, Neoplasm of liver Medications: Levothyroxine  Labs: (12/01/2023) TSH - 0.508 (low), T4 - 1.15 (in range), Potassium - 5.4 (high), CO2 - 32.9 (high), BUN - 26 (high) (02/14/2024): BUN/Creatinine ratio - 29 (High), BUN - 23 (In range), Potassium - 4.7 (In range) (NEW 05/02/2024): Glucose - 106 mg/dL (High) Notable Signs/Symptoms: Frail    Lifestyle & Dietary Hx Pt reports starting PPI for GERD but stopped due to concerns of cognitive side effects. Pt reports desire to eliminate GERD, pt states they have found less GERD symptoms when eating lighter meals, states they have difficulty eating frequently due to restrictions at work which they are concerned about getting adequate nutrition. Pt reports eating Harvest Snaps as a snack now. Pt states they will start sprouting to have to eat with their meals. Pt reports getting activity doing chores around the house, no other structured activity.    Estimated daily fluid intake: 36 oz Supplements: Caltrate Bone health gummies, Centrum MVI Sleep: Remains poor Stress / self-care: High, Work related Current average weekly physical activity: ADLs   24-Hr Dietary Recall First Meal: Apple, Spaghetti squash, Cream of wheat w/ oat, high protein pasta bows, Grape nut cereal w/ almond flour, tart cherry juice, applesauce, cheerios Snack:  Second Meal: Leftover zucchini/squash, spaghetti squash, nuts, romaine lettuce, chestnuts, Kabocha squash, pumpkin seeds Snack:   Third Meal: 1 cup of almond/soy milk Snack:  Beverages: Water, almond milk   NUTRITION DIAGNOSIS  [place]ly[place]-3.1 Underweight As related to insufficient energy intake.  As evidenced by BMI of 18.16 kg/m2, Low calorie dietary composition, skipping third meal each day, consistently high stress.   NUTRITION INTERVENTION  Nutrition education (E-1) on the following topics:  Educated patient on the role of increasing calories and protein on building muscle mass and healthy weight gain. Worked with patient to identify sources of protein and healthy fats while following a vegan diet, as well as how to increase calories when preparing food. Educated patient to eat 3 times a day to maximize protein intake.  Educated pt on increasing calories using healthy fats. Educated pt on potential increase in reflux associated with higher fat intake.    Handouts Provided Include  AVS Potassium Food List ADA Seated Exercises  Learning Style & Readiness for Change Teaching method utilized: Visual & Auditory  Demonstrated degree of understanding via: Teach Back  Barriers to learning/adherence to lifestyle change: Schedule, Stress, GERD, Resistance to change   Goals Established by Pt Eat smaller meals to help reduce reflux symptoms. Continue to incorporate more healthy fats in all of your meals like nuts, seeds, plant based oils, sun butter Continue to prioritize protein at all of your meals, add in your sprouts for more protein as well. FOCUS ON REDUCING YOUR STRESS!   MONITORING & EVALUATION Dietary intake, weekly physical activity, and weight gain PRN  Next Steps  Patient is to follow up with RD PRN.

## 2024-07-28 NOTE — Patient Instructions (Addendum)
 Eat smaller meals to help reduce reflux symptoms.  Continue to incorporate more healthy fats in all of your meals like nuts, seeds, plant based oils, sun butter  Continue to prioritize protein at all of your meals, add in your sprouts for more protein as well.  FOCUS ON REDUCING YOUR STRESS!

## 2025-04-12 ENCOUNTER — Encounter: Admitting: Physician Assistant
